# Patient Record
Sex: Female | Born: 1975 | Race: White | Hispanic: No | Marital: Married | Smoking: Never smoker
Health system: Southern US, Community
[De-identification: ages and names within clinical notes are randomized; demographics above are authoritative.]

## PROBLEM LIST (undated history)

## (undated) DIAGNOSIS — K5010 Crohn's disease of large intestine without complications: Secondary | ICD-10-CM

## (undated) DIAGNOSIS — D2339 Other benign neoplasm of skin of other parts of face: Secondary | ICD-10-CM

## (undated) DIAGNOSIS — D241 Benign neoplasm of right breast: Secondary | ICD-10-CM

## (undated) DIAGNOSIS — F419 Anxiety disorder, unspecified: Principal | ICD-10-CM

## (undated) DIAGNOSIS — G43829 Menstrual migraine, not intractable, without status migrainosus: Secondary | ICD-10-CM

## (undated) DIAGNOSIS — M1990 Unspecified osteoarthritis, unspecified site: Secondary | ICD-10-CM

## (undated) HISTORY — DX: Anxiety disorder, unspecified: F41.9

## (undated) HISTORY — PX: EYE SURGERY: SHX253

## (undated) HISTORY — DX: Benign neoplasm of right breast: D24.1

---

## 2001-01-02 ENCOUNTER — Other Ambulatory Visit: Admission: RE | Admit: 2001-01-02 | Discharge: 2001-01-02 | Payer: Self-pay | Admitting: Obstetrics and Gynecology

## 2001-06-08 ENCOUNTER — Emergency Department (HOSPITAL_COMMUNITY): Admission: EM | Admit: 2001-06-08 | Discharge: 2001-06-08 | Payer: Self-pay | Admitting: Emergency Medicine

## 2001-11-02 ENCOUNTER — Emergency Department (HOSPITAL_COMMUNITY): Admission: EM | Admit: 2001-11-02 | Discharge: 2001-11-02 | Payer: Self-pay | Admitting: Emergency Medicine

## 2002-01-18 ENCOUNTER — Encounter: Payer: Self-pay | Admitting: Internal Medicine

## 2002-01-18 ENCOUNTER — Ambulatory Visit (HOSPITAL_COMMUNITY): Admission: RE | Admit: 2002-01-18 | Discharge: 2002-01-18 | Payer: Self-pay | Admitting: Internal Medicine

## 2002-02-14 ENCOUNTER — Emergency Department (HOSPITAL_COMMUNITY): Admission: EM | Admit: 2002-02-14 | Discharge: 2002-02-14 | Payer: Self-pay | Admitting: Emergency Medicine

## 2002-05-20 ENCOUNTER — Other Ambulatory Visit: Admission: RE | Admit: 2002-05-20 | Discharge: 2002-05-20 | Payer: Self-pay | Admitting: Obstetrics and Gynecology

## 2002-05-24 LAB — HM COLONOSCOPY

## 2002-12-06 ENCOUNTER — Observation Stay (HOSPITAL_COMMUNITY): Admission: RE | Admit: 2002-12-06 | Discharge: 2002-12-06 | Payer: Self-pay | Admitting: Obstetrics and Gynecology

## 2002-12-15 ENCOUNTER — Inpatient Hospital Stay (HOSPITAL_COMMUNITY): Admission: AD | Admit: 2002-12-15 | Discharge: 2002-12-17 | Payer: Self-pay | Admitting: Obstetrics & Gynecology

## 2004-05-01 ENCOUNTER — Ambulatory Visit (HOSPITAL_COMMUNITY): Admission: RE | Admit: 2004-05-01 | Discharge: 2004-05-01 | Payer: Self-pay | Admitting: Internal Medicine

## 2004-12-15 ENCOUNTER — Emergency Department (HOSPITAL_COMMUNITY): Admission: EM | Admit: 2004-12-15 | Discharge: 2004-12-15 | Payer: Self-pay | Admitting: Emergency Medicine

## 2005-01-31 ENCOUNTER — Ambulatory Visit: Payer: Self-pay | Admitting: Internal Medicine

## 2005-06-19 ENCOUNTER — Ambulatory Visit: Payer: Self-pay | Admitting: Internal Medicine

## 2005-06-19 ENCOUNTER — Encounter (INDEPENDENT_AMBULATORY_CARE_PROVIDER_SITE_OTHER): Payer: Self-pay | Admitting: Internal Medicine

## 2005-06-19 ENCOUNTER — Ambulatory Visit (HOSPITAL_COMMUNITY): Admission: RE | Admit: 2005-06-19 | Discharge: 2005-06-19 | Payer: Self-pay | Admitting: Internal Medicine

## 2005-06-19 HISTORY — PX: COLONOSCOPY: SHX174

## 2006-01-30 ENCOUNTER — Ambulatory Visit: Payer: Self-pay | Admitting: Internal Medicine

## 2006-02-07 ENCOUNTER — Ambulatory Visit (HOSPITAL_COMMUNITY): Admission: RE | Admit: 2006-02-07 | Discharge: 2006-02-07 | Payer: Self-pay | Admitting: Internal Medicine

## 2006-04-17 ENCOUNTER — Ambulatory Visit: Payer: Self-pay | Admitting: Internal Medicine

## 2006-12-13 ENCOUNTER — Emergency Department (HOSPITAL_COMMUNITY): Admission: EM | Admit: 2006-12-13 | Discharge: 2006-12-13 | Payer: Self-pay | Admitting: Emergency Medicine

## 2007-06-11 ENCOUNTER — Other Ambulatory Visit: Admission: RE | Admit: 2007-06-11 | Discharge: 2007-06-11 | Payer: Self-pay | Admitting: Obstetrics and Gynecology

## 2008-06-13 ENCOUNTER — Other Ambulatory Visit: Admission: RE | Admit: 2008-06-13 | Discharge: 2008-06-13 | Payer: Self-pay | Admitting: Obstetrics and Gynecology

## 2009-07-06 ENCOUNTER — Other Ambulatory Visit: Admission: RE | Admit: 2009-07-06 | Discharge: 2009-07-06 | Payer: Self-pay | Admitting: Obstetrics and Gynecology

## 2009-09-19 ENCOUNTER — Ambulatory Visit (HOSPITAL_COMMUNITY): Admission: RE | Admit: 2009-09-19 | Discharge: 2009-09-19 | Payer: Self-pay | Admitting: Obstetrics & Gynecology

## 2009-10-10 ENCOUNTER — Ambulatory Visit (HOSPITAL_COMMUNITY): Admission: RE | Admit: 2009-10-10 | Discharge: 2009-10-10 | Payer: Self-pay | Admitting: Obstetrics & Gynecology

## 2009-10-31 ENCOUNTER — Ambulatory Visit (HOSPITAL_COMMUNITY): Admission: RE | Admit: 2009-10-31 | Discharge: 2009-10-31 | Payer: Self-pay | Admitting: Obstetrics & Gynecology

## 2010-03-30 ENCOUNTER — Inpatient Hospital Stay (HOSPITAL_COMMUNITY): Admission: AD | Admit: 2010-03-30 | Discharge: 2010-04-02 | Payer: Self-pay | Admitting: Obstetrics & Gynecology

## 2010-03-30 ENCOUNTER — Ambulatory Visit: Payer: Self-pay | Admitting: Advanced Practice Midwife

## 2010-07-24 ENCOUNTER — Ambulatory Visit
Admission: RE | Admit: 2010-07-24 | Discharge: 2010-07-24 | Payer: Self-pay | Source: Home / Self Care | Attending: Internal Medicine | Admitting: Internal Medicine

## 2010-07-24 LAB — CBC AND DIFFERENTIAL
HCT: 39 % (ref 36–46)
Hemoglobin: 13.2 g/dL (ref 12.0–16.0)
Platelets: 280 10*3/uL (ref 150–399)
WBC: 8.1 10^3/mL

## 2010-07-29 ENCOUNTER — Encounter: Payer: Self-pay | Admitting: Obstetrics & Gynecology

## 2010-08-02 ENCOUNTER — Emergency Department (HOSPITAL_COMMUNITY)
Admission: EM | Admit: 2010-08-02 | Discharge: 2010-08-03 | Payer: Self-pay | Source: Home / Self Care | Admitting: Emergency Medicine

## 2010-08-11 ENCOUNTER — Inpatient Hospital Stay (INDEPENDENT_AMBULATORY_CARE_PROVIDER_SITE_OTHER)
Admission: RE | Admit: 2010-08-11 | Discharge: 2010-08-11 | Disposition: A | Discharge status: Home / Self Care | Payer: BC Managed Care – PPO | Source: Ambulatory Visit | Attending: Family Medicine | Admitting: Family Medicine

## 2010-08-11 DIAGNOSIS — M109 Gout, unspecified: Secondary | ICD-10-CM

## 2010-09-20 LAB — RPR: RPR Ser Ql: NONREACTIVE

## 2010-09-20 LAB — CBC
HCT: 37.9 % (ref 36.0–46.0)
Hemoglobin: 12.9 g/dL (ref 12.0–15.0)
MCH: 30.5 pg (ref 26.0–34.0)
MCHC: 34.1 g/dL (ref 30.0–36.0)
MCV: 89.5 fL (ref 78.0–100.0)
Platelets: 156 10*3/uL (ref 150–400)
RBC: 4.24 MIL/uL (ref 3.87–5.11)
RDW: 14.8 % (ref 11.5–15.5)
WBC: 8.9 10*3/uL (ref 4.0–10.5)

## 2010-11-23 NOTE — Op Note (Signed)
   NAME:  Tamara Martin, Tamara Martin                         ACCOUNT NO.:  [id number]   MEDICAL RECORD NO.:  [id number]                   PATIENT TYPE:  INP   LOCATION:  A420                                 FACILITY:  [facility]   PHYSICIAN:  Florian Buff, M.D.                DATE OF BIRTH:  [birth year]   DATE OF PROCEDURE:  12/15/2002  DATE OF DISCHARGE:  12/17/2002                                 OPERATIVE REPORT   PROCEDURE PERFORMED:  Epidural placement.   INDICATIONS FOR PROCEDURE:  [name] is a 35 year old female Gravida 1 in  active phase of labor who requests that an epidural be placed for pain  relief.  She signed the epidural consent. She was placed in sitting  position, prepped and draped in the usual sterile fashion.  The L2-L3  interspace was identified.  1% lidocaine was injected as a local anesthetic.  A 17 gauge  Tuohy needle was used and placed in the epidural space with one  pass without difficulty.  36m of 0.125% bupivacaine was given as a test  dose with no ill effects.  An additional 148mwas given to dose up the  epidural.  An epidural catheter was placed 5 cm in to the epidural space and  a continuous pump 0.125% bupivacaine with 2 mcg per mL of fentanyl was  started.  The patient tolerated the procedure well with no ill effects and  had good pain relief.                                               LuFlorian BuffM.D.    LHEstevan OaksD:  02/01/2003  T:  02/01/2003  Job:  [id number]

## 2010-11-23 NOTE — H&P (Signed)
   NAME:  Tamara Martin, Tamara Martin                         ACCOUNT NO.:  [id number]   MEDICAL RECORD NO.:  [id number]                   PATIENT TYPE:   LOCATION:                                       FACILITY:  [facility]   PHYSICIAN:  Florian Buff, M.D.                DATE OF BIRTH:  06/11/1976   DATE OF ADMISSION:  12/15/2002  DATE OF DISCHARGE:                                HISTORY & PHYSICAL   HISTORY OF PRESENT ILLNESS:  Tamara Martin is a 35 year old, white female, G2, P1,  AB0 with estimated date of delivery of December 26, 2002, currently at 38-3/7  weeks' gestation who presented to the office this morning with cervix 3-4  cm, 50% effaced, -2 station, vertex soft and posterior.  We performed  amniotomy in the office and she is going to labor and delivery for induction  of labor.  She does have Crohn's disease and she takes Pentasa for that and  has been relatively quiescent during the pregnancy.  She has a history of an  8 pound 6 ounce baby and this child is approximately the same weight.   PAST MEDICAL HISTORY:  Crohn's disease.   PAST SURGICAL HISTORY:  Colonoscopy.   PAST OBSTETRICAL HISTORY:  Vaginal delivery in 1997, with 8 pound 6 ounce  infant.  She had an epidural.  She will have an epidural with this one as  well.   ALLERGIES:  PENICILLIN.   MEDICATIONS:  1. Vitamins.  2. Iron.  3. Pentasa four tablets four times a day.   PRENATAL LABORATORY DATA:  Blood type O positive.  Antibody screen negative.  Serology nonreactive.  Hepatitis B negative.  HIV negative.  Pap was normal.  GC and Chlamydia were negative.  Glucola was 84.  Her Group B Streptococcus  was negative.   PHYSICAL EXAMINATION:  HEENT:  Unremarkable.  Thyroid normal.  LUNGS:  Clear.  HEART:  Regular rate and rhythm without murmurs, regurgitation or gallop.  BREASTS:  Deferred.  ABDOMEN:  Fundal height 38 cm.  PELVIC:  Cervix as above.  EXTREMITIES:  Warm with no edema.   IMPRESSION:  1. Intrauterine pregnancy at  38-3/7 weeks' gestation.  2. Favorable cervix.  3. Estimated fetal weight approximately 4000 g.  4. Crohn's disease.   PLAN:  The patient is admitted for induction of labor after amniotomy.  She  understands indications and will proceed.  She does request an epidural.                                                Florian Buff, M.D.    Estevan Oaks  D:  12/15/2002  T:  12/15/2002  Job:  [id number]

## 2010-11-23 NOTE — Op Note (Signed)
   NAME:  Tamara Martin, Tamara Martin                         ACCOUNT NO.:  [id number]   MEDICAL RECORD NO.:  [id number]                   PATIENT TYPE:  INP   LOCATION:  LDR2                                 FACILITY:  [facility]   PHYSICIAN:  Florian Buff, M.D.                DATE OF BIRTH:  05-28-1976   DATE OF PROCEDURE:  12/15/2002  DATE OF DISCHARGE:                                  PROCEDURE NOTE   DELIVERY NOTE:  Date of delivery; December 15, 2002.   Onset of labor; 9 A.M.   Time of delivery; 18:46.   Length of first stage of labor; 9 hours 30 minutes.  Length of second stage  of labor; 16 minutes.  Length of third stage of labor; 4 minutes.   DETAILS OF DELIVERY:  Angelice had a normal spontaneous vaginal delivery of a  viable female infant under epidural anesthesia.  Upon delivery of head  shoulders were rotated and the rest of the infant delivered easily without  any complications upon delivery.  Nose and mouth were suctioned very  thoroughly.  The infant had good color, good tone, strong cry, and movement  of all extremities.  The cord was clamped and cut.  Infant dried and placed  on the mother's abdomen in good condition.   The third stage of labor was actively managed with 20 units of Pitocin and  1,000 mL of D5 LR at a rapid rate.   Upon inspection there was noted a first degree periurethral laceration,  which required no suturing because good hemostasis was obtained.   The placenta was delivered spontaneously via Schultze mechanism.  Membranes  were intact.  A three-vessel cord was noted upon inspection.   ESTIMATED BLOOD LOSS:  Approximately 400 mL.    DISPOSITION:  The infant and mother were both stabilized in good condition  and transferred out to the postpartum unit.   ADDENDUM:  Epidural catheter was removed with blue tip intact.       Daiva Nakayama, N.M.                      Florian Buff, M.D.    DL/MEDQ  D:  12/15/2002  T:  12/15/2002  Job:  [id number]   cc:    [facility]

## 2010-11-23 NOTE — Op Note (Signed)
[name], [name]               ACCOUNT NO.:  [id number]   MEDICAL RECORD NO.:  [id number]          PATIENT TYPE:  AMB   LOCATION:  DAY                           FACILITY:  [facility]   PHYSICIAN:  Hildred Laser, M.D.    DATE OF BIRTH:  21-Nov-1975   DATE OF PROCEDURE:  06/19/2005  DATE OF DISCHARGE:                                 OPERATIVE REPORT   PROCEDURE:  Colonoscopy.   INDICATIONS:  Tamara Martin is a 36 year old Caucasian female with 10-year history  of Crohn's disease involving the rectum and terminal ileum who is undergoing  surveillance exam. She has been maintained on mesalamine and is doing quite  well. Procedure and risks were reviewed with the patient, and informed  consent was obtained.   MEDICINES FOR CONSCIOUS SEDATION:  Demerol 50 mg IV, Versed 8 mg IV.   FINDINGS:  Procedure performed in endoscopy suite. The patient's vital signs  and O2 saturation were monitored during the procedure and remained stable.  The patient was placed in left lateral position and rectal examination  performed. No abnormality noted on external or digital exam. Olympus  videoscope was placed in rectum and advanced under vision into sigmoid colon  and beyond. Preparation was satisfactory. Scope was passed into cecum which  was identified by ileocecal valve and appendiceal orifice. Unable to examine  TI but did not send lot of time with intubation. As the scope was withdrawn,  colonic mucosa was carefully examined and was normal except rectum where  there were multiple punctate erosions involving the distal half. Pictures  taken followed by biopsy. Scope was retroflexed to examine anorectal  junction, and small hemorrhoids were noted below the dentate line. Endoscope  was straightened and withdrawn. The patient tolerated the procedure well.   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 No evidence of a mass or polyp.   RECOMMENDATIONS:   She will continue Pentasa at 1 g p.o. q.i.d. I will  contact the patient with biopsy results. May consider four-week course with  Canasa suppositories after biopsy has been reviewed.      Hildred Laser, M.D.  Electronically Signed     NR/MEDQ  D:  06/19/2005  T:  06/19/2005  Job:  [id number]   cc:   Sherrilee Gilles. Gerarda Fraction, MD  Fax: [phone]

## 2011-01-31 ENCOUNTER — Encounter (INDEPENDENT_AMBULATORY_CARE_PROVIDER_SITE_OTHER): Payer: Self-pay

## 2011-02-20 ENCOUNTER — Encounter (INDEPENDENT_AMBULATORY_CARE_PROVIDER_SITE_OTHER): Payer: Self-pay | Admitting: Internal Medicine

## 2011-02-20 ENCOUNTER — Ambulatory Visit (INDEPENDENT_AMBULATORY_CARE_PROVIDER_SITE_OTHER): Payer: BC Managed Care – PPO | Admitting: Internal Medicine

## 2011-02-20 VITALS — BP 100/70 | HR 72 | Temp 97.2°F | Ht 65.0 in | Wt 157.4 lb

## 2011-02-20 DIAGNOSIS — K5090 Crohn's disease, unspecified, without complications: Secondary | ICD-10-CM

## 2011-02-20 NOTE — Progress Notes (Signed)
Subjective:     Patient ID: Tamara Martin, female   DOB: [birth year], 35 y.o.   MRN: [id number]  HPI  MS. Thier presents today  For follow of her Crohns.  She recently called our office with symptoms of a Crohn's flare.She was started on the 19th with Canasa. She was seeing blood in her stools and she felt gassy and had indigestion.  She has been on the Canasa x 1 month.  She will be on them for about one more week.  She says she is better. No rectal bleeding. Stools are normal. She is having one stool a day. Appetite is good. No weight. Occasionally indigestion. She was diagnosed at age 66 with Crohn's by Dr. Laural Golden on colonoscopy.  Her last colonoscopy was in 2006 for surveillance. Hx of ileo-rectal Crohn's.   Review of Systems     Objective:   Physical Exam Alert and oriented. Skin warm and dry. Oral mucosa is moist. Natural teeth in good condition. Sclera anicteric, conjunctivae is pink. Thyroid not enlarged. No cervical lymphadenopathy. Lungs clear. Heart regular rate and rhythm.  Abdomen is soft. Bowel sounds are positive. No hepatomegaly. No abdominal masses felt. No tenderness.  No edema to lower extremities. Patient is alert and oriented.     Assessment:      Crohn's disease/flare. She feels 100% better now.  Plan:    [employer] .Continue present medications. She will. follow up in one year unless she has problems.

## 2011-04-01 ENCOUNTER — Ambulatory Visit (INDEPENDENT_AMBULATORY_CARE_PROVIDER_SITE_OTHER): Payer: BC Managed Care – PPO | Admitting: Internal Medicine

## 2011-04-09 ENCOUNTER — Other Ambulatory Visit: Payer: Self-pay | Admitting: Adult Health

## 2011-04-09 ENCOUNTER — Other Ambulatory Visit (HOSPITAL_COMMUNITY)
Admission: RE | Admit: 2011-04-09 | Discharge: 2011-04-09 | Disposition: A | Discharge status: Home / Self Care | Payer: BC Managed Care – PPO | Source: Ambulatory Visit | Attending: Obstetrics and Gynecology | Admitting: Obstetrics and Gynecology

## 2011-04-09 DIAGNOSIS — Z01419 Encounter for gynecological examination (general) (routine) without abnormal findings: Secondary | ICD-10-CM | POA: Insufficient documentation

## 2011-05-03 ENCOUNTER — Emergency Department (HOSPITAL_COMMUNITY)
Admission: EM | Admit: 2011-05-03 | Discharge: 2011-05-03 | Disposition: A | Discharge status: Home / Self Care | Payer: BC Managed Care – PPO | Attending: Emergency Medicine | Admitting: Emergency Medicine

## 2011-05-03 ENCOUNTER — Encounter (HOSPITAL_COMMUNITY): Payer: Self-pay

## 2011-05-03 ENCOUNTER — Emergency Department (HOSPITAL_COMMUNITY): Payer: BC Managed Care – PPO

## 2011-05-03 DIAGNOSIS — R110 Nausea: Secondary | ICD-10-CM | POA: Insufficient documentation

## 2011-05-03 DIAGNOSIS — R1013 Epigastric pain: Secondary | ICD-10-CM | POA: Insufficient documentation

## 2011-05-03 DIAGNOSIS — K5010 Crohn's disease of large intestine without complications: Secondary | ICD-10-CM | POA: Insufficient documentation

## 2011-05-03 DIAGNOSIS — R109 Unspecified abdominal pain: Secondary | ICD-10-CM | POA: Insufficient documentation

## 2011-05-03 DIAGNOSIS — R51 Headache: Secondary | ICD-10-CM | POA: Insufficient documentation

## 2011-05-03 LAB — URINALYSIS, ROUTINE W REFLEX MICROSCOPIC
Bilirubin Urine: NEGATIVE
Glucose, UA: NEGATIVE mg/dL
Ketones, ur: NEGATIVE mg/dL
Leukocytes, UA: NEGATIVE
Nitrite: NEGATIVE
Protein, ur: NEGATIVE mg/dL
Specific Gravity, Urine: 1.02 (ref 1.005–1.030)
Urobilinogen, UA: 0.2 mg/dL (ref 0.0–1.0)
pH: 6 (ref 5.0–8.0)

## 2011-05-03 LAB — CBC
HCT: 44.1 % (ref 36.0–46.0)
Hemoglobin: 14.3 g/dL (ref 12.0–15.0)
MCH: 28.5 pg (ref 26.0–34.0)
MCHC: 32.4 g/dL (ref 30.0–36.0)
MCV: 87.8 fL (ref 78.0–100.0)
Platelets: 254 10*3/uL (ref 150–400)
RBC: 5.02 MIL/uL (ref 3.87–5.11)
RDW: 13 % (ref 11.5–15.5)
WBC: 10.1 10*3/uL (ref 4.0–10.5)

## 2011-05-03 LAB — BASIC METABOLIC PANEL
BUN: 8 mg/dL (ref 6–23)
CO2: 29 mEq/L (ref 19–32)
Calcium: 9.9 mg/dL (ref 8.4–10.5)
Chloride: 98 mEq/L (ref 96–112)
Creatinine, Ser: 0.63 mg/dL (ref 0.50–1.10)
GFR calc Af Amer: >90 mL/min (ref >90)
GFR calc non Af Amer: >90 mL/min (ref >90)
Glucose, Bld: 93 mg/dL (ref 70–99)
Potassium: 3.7 mEq/L (ref 3.5–5.1)
Sodium: 135 mEq/L (ref 135–145)

## 2011-05-03 LAB — DIFFERENTIAL
Basophils Absolute: 0 10*3/uL (ref 0.0–0.1)
Basophils Relative: 0 % (ref 0–1)
Eosinophils Absolute: 0.1 10*3/uL (ref 0.0–0.7)
Eosinophils Relative: 1 % (ref 0–5)
Lymphocytes Relative: 11 % — ABNORMAL LOW (ref 12–46)
Lymphs Abs: 1.1 10*3/uL (ref 0.7–4.0)
Monocytes Absolute: 0.9 10*3/uL (ref 0.1–1.0)
Monocytes Relative: 9 % (ref 3–12)
Neutro Abs: 8.1 10*3/uL — ABNORMAL HIGH (ref 1.7–7.7)
Neutrophils Relative %: 80 % — ABNORMAL HIGH (ref 43–77)

## 2011-05-03 LAB — URINE MICROSCOPIC-ADD ON

## 2011-05-03 LAB — POCT PREGNANCY, URINE: Preg Test, Ur: NEGATIVE

## 2011-05-03 MED ORDER — OXYCODONE-ACETAMINOPHEN 5-325 MG PO TABS: 1.0000 | ORAL_TABLET | ORAL | Status: AC | PRN

## 2011-05-03 MED ORDER — SODIUM CHLORIDE 0.9 % IV BOLUS (SEPSIS)
1000.0000 mL | Freq: Once | INTRAVENOUS | Status: AC
  Administered 2011-05-03: 1000 mL via INTRAVENOUS

## 2011-05-03 MED ORDER — ONDANSETRON HCL 4 MG/2ML IJ SOLN
4.0000 mg | Freq: Once | INTRAMUSCULAR | Status: AC
  Administered 2011-05-03: 4 mg via INTRAVENOUS
  Filled 2011-05-03: qty 2

## 2011-05-03 MED ORDER — IOHEXOL 300 MG/ML  SOLN
100.0000 mL | Freq: Once | INTRAMUSCULAR | Status: AC | PRN
  Administered 2011-05-03: 100 mL via INTRAVENOUS

## 2011-05-03 MED ORDER — HYDROMORPHONE HCL 1 MG/ML IJ SOLN
1.0000 mg | Freq: Once | INTRAMUSCULAR | Status: AC
  Administered 2011-05-03: 1 mg via INTRAVENOUS
  Filled 2011-05-03: qty 1

## 2011-05-03 NOTE — ED Provider Notes (Signed)
History   This chart was scribed for Sharyon Cable, MD by Carolyne Littles. The patient was seen in room APA11/APA11 and the patient's care was started at 8:30PM.  CSN: [id number] Arrival date & time: 05/03/2011  7:43 PM   First MD Initiated Contact with Patient 05/03/11 2008      Chief Complaint  Patient presents with  . Flank Pain  . Abdominal Pain  . Nausea   HPI Tamara Martin is a 35 y.o. female who presents to the Emergency Department complaining of constant moderate, non-radiating abdominal pain described as cramping onset last night which awoke her from sleep and persistent since with associated nausea and mild dull HA. Denies vomiting, diarrhea, rectal bleeding, fever, dysuria, vaginal bleeding, vaginal d/c, cough, SOB. Reports current symptoms are not similar to episode of Crohn's exacerbations. Denies h/o abdominal surgery.  GI- Dr. Laural Golden   Past Medical History  Diagnosis Date  . Crohn's disease of rectum 07/24/2010  . Rectal bleeding 07/24/2010    Past Surgical History  Procedure Date  . Colonoscopy 2006    History reviewed. No pertinent family history.  History  Substance Use Topics  . Smoking status: Never Smoker   . Smokeless tobacco: Not on file  . Alcohol Use: No    OB History    Grav Para Term Preterm Abortions TAB SAB Ect Mult Living                  Review of Systems 10 Systems reviewed and are negative for acute change except as noted in the HPI.  Allergies  Penicillins  Home Medications   Current Outpatient Rx  Name Route Sig Dispense Refill  . MESALAMINE 500 MG PO CPCR Oral Take 1,000 mg by mouth 4 (four) times daily.     Creed Copper M PLUS PO TABS Oral Take 1 tablet by mouth daily.      Marland Kitchen OVER THE COUNTER MEDICATION Oral Take 1 tablet by mouth daily. COLON HEALTH: Supplement     . OVER THE COUNTER MEDICATION Oral Take 1 tablet by mouth daily. ANTI-STRESS: Supplements     . PROMETHAZINE HCL 25 MG RE SUPP Rectal Place 25 mg rectally  every 8 (eight) hours as needed. For nausea     . FLINTSTONES COMPLETE 60 MG PO CHEW Oral Chew 1 tablet by mouth daily.        BP 123/58  Pulse 122  Temp(Src) 99.2 F (37.3 C) (Oral)  Resp 18  Ht 5' 5"  (1.651 m)  Wt 153 lb (69.4 kg)  BMI 25.46 kg/m2  SpO2 100%  LMP 04/19/2011  Physical Exam CONSTITUTIONAL: Well developed/well nourished HEAD AND FACE: Normocephalic/atraumatic EYES: EOMI/PERRL ENMT: Mucous membranes moist NECK: supple no meningeal signs CV: S1/S2 noted, no murmurs/rubs/gallops noted, tachycardic LUNGS: Lungs are clear to auscultation bilaterally, no apparent distress ABDOMEN: soft, no rebound or guarding, focal tenderness over umbilicus, no RLQ tenderness noted GU:no cva tenderness NEURO: Pt is awake/alert, moves all extremitiesx4 EXTREMITIES: pulses normal, full ROM SKIN: warm, color normal PSYCH: no abnormalities of mood noted  ED Course  Procedures (including critical care time)  DIAGNOSTIC STUDIES: Oxygen Saturation is 100% on room air, normal by my interpretation.    COORDINATION OF CARE: 9:55PM- Patient reports abdominal pain much improved following administration of pain medications. Informed of lab and imaging results. Patient offered option of having a pelvic exam performed and declines. Patient will attempt to tolerate fluids PO. Intent to d/c home. Patient agrees with plan set forth  at this time.   10:55 PM abd soft, no focal RLQ tenderness.  Doubt acute GYN process She feels improved I discussed at length signs/symptoms of when to return  Labs Reviewed  URINALYSIS, [place] - Abnormal; Notable for the following:    Hgb urine dipstick TRACE (*)    All other components within normal limits  DIFFERENTIAL - Abnormal; Notable for the following:    Neutrophils Relative 80 (*)    Neutro Abs 8.1 (*)    Lymphocytes Relative 11 (*)    All other components within normal limits  URINE MICROSCOPIC-ADD ON - Abnormal; Notable for  the following:    Squamous Epithelial / LPF FEW (*)    All other components within normal limits  CBC  BASIC METABOLIC PANEL  POCT PREGNANCY, URINE  POCT PREGNANCY, URINE   Ct Abdomen Pelvis W Contrast  05/03/2011  *RADIOLOGY REPORT*  Clinical Data: Epigastric abdominal pain and bilateral flank pain. Nausea and vomiting.  CT ABDOMEN AND PELVIS WITH CONTRAST 05/03/2011:  Technique:  Multidetector CT imaging of the abdomen and pelvis was performed following the standard protocol during bolus administration of intravenous contrast.  Contrast: 165m OMNIPAQUE IOHEXOL 300 MG/ML IV. Oral contrast was also administered.  Comparison: None.  Findings: Normal appearing liver, spleen, adrenal glands, and kidneys.  Atrophic pancreatic tail, with normal appearing pancreas otherwise.  Gallbladder unremarkable by CT.  No biliary ductal dilation.  No visible aorto-iliofemoral atherosclerosis.  No significant lymphadenopathy.  Stomach, small bowel, and colon normal in appearance.  Liquid stool in the cecum causing air-fluid levels.  Normal appearing decompressed appendix in the right upper pelvis.  No ascites.  Uterus retroverted but normal in appearance for age.  Bilateral ovarian cysts consistent with age.  No adnexal masses or free pelvic fluid.  Urinary bladder decompressed and unremarkable. Pelvic phleboliths.  Bone window images unremarkable.  Calcified granuloma in the right lower lobe; visualized lung bases otherwise clear.  Heart size normal.  IMPRESSION:  1.  No acute abnormalities involving the abdomen or pelvis. 2.  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 3.  Calcified granuloma deep in the right lower lobe.  Original Report Authenticated By: TDeniece Portela M.D.        MDM  Nursing notes reviewed and considered in documentation All labs/vitals reviewed and considered    I personally performed the services described in  this documentation, which was scribed in my presence. The recorded information has been reviewed and considered.     DSharyon Cable MD 05/03/11 2257

## 2011-05-03 NOTE — ED Notes (Signed)
Pt presents with abdominal pain, nausea, and right sided flank pain. Pt denies V/D. Pt states symptoms started last night.

## 2011-05-03 NOTE — ED Notes (Signed)
Generalized abd pain, nausea, no vomiting, no diarrhea.  Color good, NAD.

## 2011-08-05 ENCOUNTER — Telehealth (INDEPENDENT_AMBULATORY_CARE_PROVIDER_SITE_OTHER): Payer: Self-pay | Admitting: *Deleted

## 2011-08-05 NOTE — Telephone Encounter (Signed)
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 The doctor put her on  Sulfa sylizine and was told this would treat her athristis and crohn's diease. Tamara Martin is want to know if she should continue the Pentasa? Scheduled her an apt to see Dr. Laural Golden next Monday 08/12/11 but still would like for the nurse to return her call. The return phone number is [phone].

## 2011-08-07 NOTE — Telephone Encounter (Signed)
Patient was called and told that per Dr. Laural Golden, to keep appointment on 08-12-11 and the medication will be discussed at that time.

## 2011-08-12 ENCOUNTER — Ambulatory Visit (INDEPENDENT_AMBULATORY_CARE_PROVIDER_SITE_OTHER): Payer: BC Managed Care – PPO | Admitting: Internal Medicine

## 2011-08-12 ENCOUNTER — Encounter (INDEPENDENT_AMBULATORY_CARE_PROVIDER_SITE_OTHER): Payer: Self-pay | Admitting: Internal Medicine

## 2011-08-12 VITALS — BP 100/74 | HR 76 | Temp 97.0°F | Resp 14 | Ht 65.0 in | Wt 158.0 lb

## 2011-08-12 DIAGNOSIS — K5090 Crohn's disease, unspecified, without complications: Secondary | ICD-10-CM

## 2011-08-12 DIAGNOSIS — M1712 Unilateral primary osteoarthritis, left knee: Secondary | ICD-10-CM | POA: Insufficient documentation

## 2011-08-12 NOTE — Patient Instructions (Signed)
Take one B complex tablet by mouth daily(OTC) Calcium with vitamin D 2 tablets daily(OTC)

## 2011-08-12 NOTE — Progress Notes (Signed)
Presenting complaint; Followup for Crohn's disease. Subjective: Tamara Martin is 36 year old Caucasian female who is 48 year history of ileal and rectal Crohn's disease who is here for scheduled visit. About 6 months ago she was felt to have mild flareup with rectal symptoms and treated with Canasa suppositories. She now feels fine. She generally has one formed stool daily. She denies rectal discharge melena or rectal bleeding. She also denies abdominal pain. She has a good appetite. She complains of excessive flatulence. She's been taking: Health( probiotic) for one month and she believes is helping her. She developed left knee arthritis with effusion she was seen by Dr. 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 She was given sulfasalazine which she stopped after taking for a week but she developed multiple side effects including fatigue and she felt depressed. She was also given an option off Humira but but after reading the side effects she is not sure if she wants to be treated with Humira. Patient developed acute onset of epigastric pain on 05/03/2011 and was evaluated in emergency room. She had abdominopelvic CT which was unremarkable. She also had lab studies which are unremarkable. She has not experienced relapse of this pain.  Current Medications: Current Outpatient Prescriptions  Medication Sig Dispense Refill  . cyclobenzaprine (FLEXERIL) 10 MG tablet 10 mg. As needed for headaches      . mesalamine (PENTASA) 500 MG CR capsule Take 1,000 mg by mouth 4 (four) times daily.       Marland Kitchen OVER THE COUNTER MEDICATION Take 1 tablet by mouth daily. COLON HEALTH: Supplement       . promethazine (PHENERGAN) 25 MG suppository Place 25 mg rectally every 8 (eight) hours as needed. For nausea         Objective: Blood pressure 100/74, pulse 76, temperature 97 F (36.1 C), temperature source Oral, resp. rate 14, height 5' 5"  (1.651 m), weight 158 lb (71.668 kg), last menstrual period  08/05/2011.  Conjunctiva is pink. Sclera is nonicteric Oral pharyngeal mucosa is normal. No neck masses or thyromegaly noted. Cardiac exam with regular rhythm normal S1 and S2. No murmur or gallop noted. Lungs are clear to auscultation. Abdomen abdomen is soft and nontender without organomegaly or masses the No LE edema or clubbing noted.  Lab data; Abdominopelvic CT showed some 05/03/2011 reviewed no evidence of ileal wall thickening. WBC 10.1, hemoglobin 14.3, hematocrit 44.1, platelet 254K ( 05/03/2011)  Assessment: #1. Ileal and rectal Crohn's disease remains in remission. Patient has been on Pentasa for several years and has done well. 2. 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   Plan: Continue Pentasa at 1 g by mouth 4 times a day. B complex 1 tablet by mouth daily. Calcium with vitamin D 2 tablets daily. Office visit in one year.

## 2011-10-16 ENCOUNTER — Other Ambulatory Visit (INDEPENDENT_AMBULATORY_CARE_PROVIDER_SITE_OTHER): Payer: Self-pay | Admitting: Internal Medicine

## 2012-08-22 ENCOUNTER — Other Ambulatory Visit (INDEPENDENT_AMBULATORY_CARE_PROVIDER_SITE_OTHER): Payer: Self-pay | Admitting: Internal Medicine

## 2012-08-29 ENCOUNTER — Other Ambulatory Visit (INDEPENDENT_AMBULATORY_CARE_PROVIDER_SITE_OTHER): Payer: Self-pay | Admitting: Internal Medicine

## 2012-08-31 ENCOUNTER — Encounter (INDEPENDENT_AMBULATORY_CARE_PROVIDER_SITE_OTHER): Payer: Self-pay | Admitting: *Deleted

## 2012-09-14 ENCOUNTER — Ambulatory Visit (INDEPENDENT_AMBULATORY_CARE_PROVIDER_SITE_OTHER): Payer: BC Managed Care – PPO | Admitting: Internal Medicine

## 2012-09-21 ENCOUNTER — Ambulatory Visit (INDEPENDENT_AMBULATORY_CARE_PROVIDER_SITE_OTHER): Payer: BC Managed Care – PPO | Admitting: Internal Medicine

## 2012-10-12 ENCOUNTER — Other Ambulatory Visit: Payer: Self-pay | Admitting: Adult Health

## 2012-10-14 ENCOUNTER — Encounter: Payer: Self-pay | Admitting: *Deleted

## 2012-10-14 DIAGNOSIS — Z8669 Personal history of other diseases of the nervous system and sense organs: Secondary | ICD-10-CM | POA: Insufficient documentation

## 2012-10-15 ENCOUNTER — Ambulatory Visit (INDEPENDENT_AMBULATORY_CARE_PROVIDER_SITE_OTHER): Payer: BC Managed Care – PPO | Admitting: Adult Health

## 2012-10-15 ENCOUNTER — Other Ambulatory Visit (HOSPITAL_COMMUNITY)
Admission: RE | Admit: 2012-10-15 | Discharge: 2012-10-15 | Disposition: A | Discharge status: Home / Self Care | Payer: BC Managed Care – PPO | Source: Ambulatory Visit | Attending: Adult Health | Admitting: Adult Health

## 2012-10-15 ENCOUNTER — Encounter: Payer: Self-pay | Admitting: Adult Health

## 2012-10-15 VITALS — BP 120/70 | HR 76 | Ht 65.0 in | Wt 154.0 lb

## 2012-10-15 DIAGNOSIS — Z0000 Encounter for general adult medical examination without abnormal findings: Secondary | ICD-10-CM

## 2012-10-15 DIAGNOSIS — R319 Hematuria, unspecified: Secondary | ICD-10-CM

## 2012-10-15 DIAGNOSIS — Z01419 Encounter for gynecological examination (general) (routine) without abnormal findings: Secondary | ICD-10-CM | POA: Insufficient documentation

## 2012-10-15 DIAGNOSIS — Z1151 Encounter for screening for human papillomavirus (HPV): Secondary | ICD-10-CM | POA: Insufficient documentation

## 2012-10-15 DIAGNOSIS — K5090 Crohn's disease, unspecified, without complications: Secondary | ICD-10-CM

## 2012-10-15 DIAGNOSIS — J329 Chronic sinusitis, unspecified: Secondary | ICD-10-CM

## 2012-10-15 DIAGNOSIS — M545 Low back pain: Secondary | ICD-10-CM

## 2012-10-15 DIAGNOSIS — Z8669 Personal history of other diseases of the nervous system and sense organs: Secondary | ICD-10-CM

## 2012-10-15 LAB — POCT URINALYSIS DIPSTICK
Ketones, UA: NEGATIVE
Protein, UA: NEGATIVE

## 2012-10-15 MED ORDER — AZITHROMYCIN 250 MG PO TABS: ORAL_TABLET | ORAL | Status: DC

## 2012-10-15 NOTE — Progress Notes (Signed)
Patient ID: Tamara Martin, female   DOB: 1975/12/22, 37 y.o.   MRN: [id number] History of Present Illness: Tamara Martin is a 37 year old white female in for pap and physical. She is having some nasal symptoms and low back pain and stress urinary incontinence.   Current Medications, Allergies, Past Medical History, Past Surgical History, Family History and Social History were reviewed in [employer] record.     Review of Systems:Patient denies  any  blurred vision, shortness of breath, chest pain, abdominal pain, problems with bowel movements, or intercourse. She does have some stress incontinence at times and low back ache. She has some headaches occasionally and nasal symptoms.She has some pain in her knees at times.No mood changes.She is currently using condoms and trying to get her husband to get vasectomy.We discussed POPs and her headaches and decided to continue condoms.  Physical Exam:Blood pressure 120/70, pulse 76, height 5' 5"  (1.651 m), weight 154 lb (69.854 kg), last menstrual period 09/24/2012.BMI:26.Urine:+Blood and WBC General:  Well developed, well nourished, no acute distress Skin:  Warm and dry, positive frontal sinus tenderness Neck:  Midline trachea, normal thyroid Lungs; Clear to auscultation bilaterally Breast:  No dominant palpable mass, retraction, or nipple discharge Cardiovascular: Regular rate and rhythm Abdomen:  Soft, non tender, no hepatosplenomegaly. No CVAT Pelvic:  External genitalia is normal in appearance.  The vagina is normal in appearance. The cervix is bulbous.  Uterus is felt to be normal size, shape, and contour.  No adnexal masses or tenderness noted. Extremities:  No swelling or varicosities noted Psych:  No mood changes   Impression: Yearly exam Hematuria Sinus infection Hx. Crohn's disease Hx. migraines  Plan:Rx. Z pack Increase fluids Mammogram at 40  Continue condom use Follow up urine next week Return to clinic 1  year physical,prn problems

## 2012-10-15 NOTE — Patient Instructions (Addendum)
Try zyrtec or allegra OTC Increase Fluids Take  Z-pack Follow up 1 year physical Mammogram at 14. Chart up for my chart

## 2012-10-16 DIAGNOSIS — J3489 Other specified disorders of nose and nasal sinuses: Secondary | ICD-10-CM | POA: Insufficient documentation

## 2012-10-16 LAB — URINALYSIS
Glucose, UA: NEGATIVE mg/dL
Hgb urine dipstick: NEGATIVE
Ketones, ur: NEGATIVE mg/dL
Nitrite: NEGATIVE
Protein, ur: NEGATIVE mg/dL
pH: 7 (ref 5.0–8.0)

## 2012-10-17 ENCOUNTER — Encounter (HOSPITAL_COMMUNITY): Payer: Self-pay

## 2012-10-17 ENCOUNTER — Emergency Department (HOSPITAL_COMMUNITY)
Admission: EM | Admit: 2012-10-17 | Discharge: 2012-10-17 | Discharge status: Against Medical Advice | Payer: BC Managed Care – PPO | Attending: Emergency Medicine | Admitting: Emergency Medicine

## 2012-10-17 NOTE — ED Notes (Signed)
Sinus pressure, throat irritation, states started a z pack on Thursday but has not helped

## 2012-10-17 NOTE — ED Notes (Signed)
Pt called in all waiting areas without any answer

## 2012-10-17 NOTE — ED Notes (Signed)
No answer in all waiting areas

## 2012-11-16 ENCOUNTER — Ambulatory Visit (INDEPENDENT_AMBULATORY_CARE_PROVIDER_SITE_OTHER): Payer: BC Managed Care – PPO | Admitting: Internal Medicine

## 2012-11-16 ENCOUNTER — Encounter (INDEPENDENT_AMBULATORY_CARE_PROVIDER_SITE_OTHER): Payer: Self-pay | Admitting: Internal Medicine

## 2012-11-16 ENCOUNTER — Telehealth: Payer: Self-pay | Admitting: Adult Health

## 2012-11-16 VITALS — BP 114/74 | HR 76 | Temp 99.1°F | Resp 18 | Ht 68.0 in | Wt 151.4 lb

## 2012-11-16 DIAGNOSIS — K5090 Crohn's disease, unspecified, without complications: Secondary | ICD-10-CM

## 2012-11-16 NOTE — Patient Instructions (Signed)
Notify if you have abdominal pain, diarrhea or rectal bleeding.

## 2012-11-16 NOTE — Telephone Encounter (Signed)
Pt informed of WNL pap and no growth with urine culture

## 2012-11-16 NOTE — Progress Notes (Signed)
Presenting complaint;  Followup for Crohn's disease.  Subjective:  Tamara Martin is 37 year old Caucasian female with history of ileal and rectal Crohn's disease. She was last seen in February 2013. She feels well. She has no complaints. Her bowels move daily. She denies abdominal pain melena or rectal bleeding. She has very good appetite. She has lost 7 pounds since her last visit which is voluntary. She is trying to increase fiber intake and also takes fiber gummy pill. Patient's last colonoscopy was in December 2006 revealing proctitis. TI could not be examined. She had normal small bowel follow-through in August 2007.   Current Medications: Current Outpatient Prescriptions  Medication Sig Dispense Refill  . b complex vitamins capsule Take 1 capsule by mouth daily.      . calcium-vitamin D (OSCAL WITH D) 500-200 MG-UNIT per tablet Take 1 tablet by mouth daily.      . cholecalciferol (VITAMIN D) 1000 UNITS tablet Take 1,000 Units by mouth every other day.      . cyclobenzaprine (FLEXERIL) 10 MG tablet 10 mg. As needed for headaches      . fish oil-omega-3 fatty acids 1000 MG capsule Take 1 g by mouth daily.      Marland Kitchen OVER THE COUNTER MEDICATION Take 1 tablet by mouth daily. COLON HEALTH: Supplement       . PENTASA 500 MG CR capsule TAKE 2 CAPSULES BY MOUTH 4 TIMES A DAY  240 capsule  11   No current facility-administered medications for this visit.     Objective: Blood pressure 114/74, pulse 76, temperature 99.1 F (37.3 C), temperature source Oral, resp. rate 18, height 5' 8"  (1.727 m), weight 151 lb 6.4 oz (68.675 kg). Patient is alert and in no acute distress. Conjunctiva is pink. Sclera is nonicteric Oropharyngeal mucosa is normal. No neck masses or thyromegaly noted. Cardiac exam with regular rhythm normal S1 and S2. No murmur or gallop noted. Lungs are clear to auscultation. Abdomen is symmetrical soft and nontender without organomegaly or masses to No LE edema or clubbing  noted.  Labs/studies Results: Patient will bring [place] a copy of the blood work that she had through her employer. Also reviewed abdominopelvic CT from October 2012 and she was seen in emergency room. There is a short loop of proximal jejunum which appeared to be thickened. TI was normal.  Assessment:  #1. Ileo-rectal Crohn's disease. Disease duration about 18 years. She has had very mild course. She is doing very well with oral mesalamine. Brief illness in October 2012 would appear to be due to 2 acute jejunitis    Plan:  Continue Pentasa at present dose. Patient will send [place] a copy of blood work for review. Office visit in one year.

## 2012-11-23 ENCOUNTER — Encounter (INDEPENDENT_AMBULATORY_CARE_PROVIDER_SITE_OTHER): Payer: Self-pay

## 2012-12-11 ENCOUNTER — Encounter (INDEPENDENT_AMBULATORY_CARE_PROVIDER_SITE_OTHER): Payer: Self-pay

## 2013-02-24 ENCOUNTER — Encounter: Payer: Self-pay | Admitting: Adult Health

## 2013-02-24 ENCOUNTER — Ambulatory Visit (INDEPENDENT_AMBULATORY_CARE_PROVIDER_SITE_OTHER): Payer: BC Managed Care – PPO | Admitting: Adult Health

## 2013-02-24 VITALS — BP 118/82 | Ht 65.0 in | Wt 154.0 lb

## 2013-02-24 DIAGNOSIS — R5383 Other fatigue: Secondary | ICD-10-CM

## 2013-02-24 DIAGNOSIS — R319 Hematuria, unspecified: Secondary | ICD-10-CM

## 2013-02-24 DIAGNOSIS — N926 Irregular menstruation, unspecified: Secondary | ICD-10-CM | POA: Insufficient documentation

## 2013-02-24 DIAGNOSIS — R5381 Other malaise: Secondary | ICD-10-CM

## 2013-02-24 LAB — COMPREHENSIVE METABOLIC PANEL
BUN: 13 mg/dL (ref 6–23)
CO2: 29 mEq/L (ref 19–32)
Calcium: 9.5 mg/dL (ref 8.4–10.5)
Chloride: 102 mEq/L (ref 96–112)
Creat: 0.65 mg/dL (ref 0.50–1.10)
Glucose, Bld: 82 mg/dL (ref 70–99)
Total Bilirubin: 0.3 mg/dL (ref 0.3–1.2)

## 2013-02-24 LAB — POCT URINALYSIS DIPSTICK
Blood, UA: 1
Nitrite, UA: NEGATIVE

## 2013-02-24 LAB — CBC
HCT: 39.3 % (ref 36.0–46.0)
MCH: 29.1 pg (ref 26.0–34.0)
MCV: 88.5 fL (ref 78.0–100.0)
RBC: 4.44 MIL/uL (ref 3.87–5.11)
WBC: 8 10*3/uL (ref 4.0–10.5)

## 2013-02-24 LAB — TSH: TSH: 1.151 u[IU]/mL (ref 0.350–4.500)

## 2013-02-24 NOTE — Patient Instructions (Addendum)

## 2013-02-24 NOTE — Progress Notes (Signed)
Subjective:     Patient ID: Flavia Shipper, female   DOB: 08/07/1975, 37 y.o.   MRN: [id number]  HPI Amyia is a 37 year old white female in complaining of fatigue,hair loss increasing migraines and irregular cycles,and she has had discomfort in low back recently.She has history of Crohn's disease.She is off OCs and using condoms.Husband to get vasectomy in September.  Review of Systems Positives in HPI Reviewed past medical,surgical, social and family history. Reviewed medications and allergies.     Objective:   Physical Exam BP 118/82  Ht 5' 5"  (1.651 m)  Wt 154 lb (69.854 kg)  BMI 25.63 kg/m2  LMP 02/11/2013   Urine dipstick +1 blood and trace protein,  Talk only today, see HPI Assessment:     Fatigue Irregular menses Hair loss Increasing migraines  Hematuria     Plan:    Check CBC,CMP,TSH Urine sent for UA C&S Follow up in 1 week ,can use my chart to check labs    Review handout on fatigue

## 2013-02-25 LAB — URINALYSIS
Glucose, UA: NEGATIVE mg/dL
Hgb urine dipstick: NEGATIVE
Leukocytes, UA: NEGATIVE
Nitrite: NEGATIVE
Protein, ur: NEGATIVE mg/dL
Urobilinogen, UA: 1 mg/dL (ref 0.0–1.0)

## 2013-02-26 ENCOUNTER — Telehealth: Payer: Self-pay | Admitting: Adult Health

## 2013-02-26 LAB — URINE CULTURE
Colony Count: NO GROWTH
Organism ID, Bacteria: NO GROWTH

## 2013-02-26 NOTE — Telephone Encounter (Signed)
Pt aware of labs  

## 2013-03-03 ENCOUNTER — Ambulatory Visit: Payer: BC Managed Care – PPO | Admitting: Adult Health

## 2013-03-04 ENCOUNTER — Encounter: Payer: Self-pay | Admitting: Adult Health

## 2013-03-04 ENCOUNTER — Ambulatory Visit (INDEPENDENT_AMBULATORY_CARE_PROVIDER_SITE_OTHER): Payer: BC Managed Care – PPO | Admitting: Adult Health

## 2013-03-04 VITALS — BP 120/80 | Ht 65.0 in | Wt 155.0 lb

## 2013-03-04 DIAGNOSIS — F419 Anxiety disorder, unspecified: Secondary | ICD-10-CM | POA: Insufficient documentation

## 2013-03-04 DIAGNOSIS — F411 Generalized anxiety disorder: Secondary | ICD-10-CM

## 2013-03-04 HISTORY — DX: Anxiety disorder, unspecified: F41.9

## 2013-03-04 MED ORDER — SERTRALINE HCL 25 MG PO TABS: 25.0000 mg | ORAL_TABLET | Freq: Every day | ORAL | Status: DC

## 2013-03-04 NOTE — Patient Instructions (Addendum)
Anxiety and Panic Attacks Your caregiver has informed you that you are having an anxiety or panic attack. There may be many forms of this. Most of the time these attacks come suddenly and without warning. They come at any time of day, including periods of sleep, and at any time of life. They may be strong and unexplained. Although panic attacks are very scary, they are physically harmless. Sometimes the cause of your anxiety is not known. Anxiety is a protective mechanism of the body in its fight or flight mechanism. Most of these perceived danger situations are actually nonphysical situations (such as anxiety over losing a job). CAUSES  The causes of an anxiety or panic attack are many. Panic attacks may occur in otherwise healthy people given a certain set of circumstances. There may be a genetic cause for panic attacks. Some medications may also have anxiety as a side effect. SYMPTOMS  Some of the most common feelings are:  Intense terror.  Dizziness, feeling faint.  Hot and cold flashes.  Fear of going crazy.  Feelings that nothing is real.  Sweating.  Shaking.  Chest pain or a fast heartbeat (palpitations).  Smothering, choking sensations.  Feelings of impending doom and that death is near.  Tingling of extremities, this may be from over-breathing.  Altered reality (derealization).  Being detached from yourself (depersonalization). Several symptoms can be present to make up anxiety or panic attacks. DIAGNOSIS  The evaluation by your caregiver will depend on the type of symptoms you are experiencing. The diagnosis of anxiety or panic attack is made when no physical illness can be determined to be a cause of the symptoms. TREATMENT  Treatment to prevent anxiety and panic attacks may include:  Avoidance of circumstances that cause anxiety.  Reassurance and relaxation.  Regular exercise.  Relaxation therapies, such as yoga.  Psychotherapy with a psychiatrist or  therapist.  Avoidance of caffeine, alcohol and illegal drugs.  Prescribed medication. SEEK IMMEDIATE MEDICAL CARE IF:   You experience panic attack symptoms that are different than your usual symptoms.  You have any worsening or concerning symptoms. Document Released: 06/24/2005 Document Revised: 09/16/2011 Document Reviewed: 10/26/2009 [facility] Patient Information 2014 [place], [place]. Start zoloft  Follow up in 6 weeks

## 2013-03-04 NOTE — Progress Notes (Signed)
Subjective:     Patient ID: Tamara Martin, female   DOB: [birth year], 37 y.o.   MRN: [id number]  HPI Tamara Martin is back to review and labs and talk,labs were al normal, job is stressful and she feels torn between work and home.Teary today when discussing work.  Review of Systems See HPI Reviewed past medical,surgical, social and family history. Reviewed medications and allergies.     Objective:   Physical Exam BP 120/80  Ht 5' 5"  (1.651 m)  Wt 155 lb (70.308 kg)  BMI 25.79 kg/m2  LMP 02/11/2013   talk only ,see HPI, discussed that her fatigue and hair loss and irregular cycles may be stress related and that she is having anxiety.  Assessment:     Anxiety     Plan:     Rx zoloft 25 mg #30 1 daily with 6 refills Take time for self and if it does not matter in 2 weeks it does not matter today Review handout on anxiety Follow up in 6 weeks

## 2013-03-10 ENCOUNTER — Encounter: Payer: Self-pay | Admitting: Adult Health

## 2013-03-10 ENCOUNTER — Telehealth: Payer: Self-pay | Admitting: *Deleted

## 2013-03-10 NOTE — Telephone Encounter (Signed)
Pt states Zoloft 25 mg making her very sleepy. Pt states has tried taking at night but still feels sleepy during the day. Also, states blood showed in her urine at her last appt and concerned. Pt informed per Derrek Monaco, NP that she can take 1/2 of the 25 mg Zoloft and see if that help improves symptoms. Pt also informed UA and culture done at 02/24/2013 appt and were both negative. Pt verbalized understanding.

## 2013-04-15 ENCOUNTER — Ambulatory Visit (INDEPENDENT_AMBULATORY_CARE_PROVIDER_SITE_OTHER): Payer: BC Managed Care – PPO | Admitting: Adult Health

## 2013-04-15 ENCOUNTER — Encounter: Payer: Self-pay | Admitting: Adult Health

## 2013-04-15 VITALS — BP 120/78 | Ht 65.0 in | Wt 156.0 lb

## 2013-04-15 DIAGNOSIS — F411 Generalized anxiety disorder: Secondary | ICD-10-CM

## 2013-04-15 DIAGNOSIS — F419 Anxiety disorder, unspecified: Secondary | ICD-10-CM

## 2013-04-15 NOTE — Progress Notes (Signed)
Subjective:     Patient ID: Tamara Martin, female   DOB: 05/13/1976, 37 y.o.   MRN: [id number]  HPI Tamara Martin is a 37 year old white female in today in follow up of starting zoloft and she says she is much better, and does not seem to have any side effects.   Review of Systems See HPI Reviewed past medical,surgical, social and family history. Reviewed medications and allergies.     Objective:   Physical Exam BP 120/78  Ht 5' 5"  (1.651 m)  Wt 156 lb (70.761 kg)  BMI 25.96 kg/m2  LMP 04/07/2013   we just talked today and she is better so will continue current dose  Assessment:     Anxiety     Plan:     Continue zoloft  Call if any problems or concerns, follow up prn

## 2013-04-15 NOTE — Patient Instructions (Signed)
Continue zoloft 25 mg Follow up prn

## 2013-05-13 ENCOUNTER — Other Ambulatory Visit: Payer: Self-pay

## 2013-07-04 ENCOUNTER — Other Ambulatory Visit: Payer: Self-pay | Admitting: Adult Health

## 2013-09-14 ENCOUNTER — Encounter (INDEPENDENT_AMBULATORY_CARE_PROVIDER_SITE_OTHER): Payer: Self-pay | Admitting: *Deleted

## 2013-09-14 ENCOUNTER — Other Ambulatory Visit: Payer: Self-pay | Admitting: Adult Health

## 2013-09-14 ENCOUNTER — Other Ambulatory Visit (INDEPENDENT_AMBULATORY_CARE_PROVIDER_SITE_OTHER): Payer: Self-pay | Admitting: Internal Medicine

## 2013-09-14 DIAGNOSIS — K5010 Crohn's disease of large intestine without complications: Secondary | ICD-10-CM

## 2013-09-28 ENCOUNTER — Telehealth (INDEPENDENT_AMBULATORY_CARE_PROVIDER_SITE_OTHER): Payer: Self-pay | Admitting: *Deleted

## 2013-09-28 NOTE — Telephone Encounter (Signed)
Per Dr.Rehman may call in Canasa Suppository - 1 per rectal at bedtime #30 with 2 refills. This was called to [facility] [place]. A message was left for the on her voicemail, letting her know this.

## 2013-09-28 NOTE — Telephone Encounter (Signed)
Tamara Martin has seen blood in her stool. Dr. Laural Golden has given her [place] when this has happened in the past. Would like to know if he would please send in a Rx for this. Her return phone number is [phone].

## 2013-11-10 ENCOUNTER — Ambulatory Visit (INDEPENDENT_AMBULATORY_CARE_PROVIDER_SITE_OTHER): Payer: BC Managed Care – PPO | Admitting: Adult Health

## 2013-11-10 ENCOUNTER — Encounter: Payer: Self-pay | Admitting: Adult Health

## 2013-11-10 VITALS — BP 110/70 | HR 76 | Ht 65.0 in | Wt 148.0 lb

## 2013-11-10 DIAGNOSIS — Z01419 Encounter for gynecological examination (general) (routine) without abnormal findings: Secondary | ICD-10-CM

## 2013-11-10 NOTE — Patient Instructions (Signed)
Physical in 1 year Mammogram at 40 Call prn

## 2013-11-10 NOTE — Progress Notes (Signed)
Patient ID: Tamara Martin, female   DOB: [birth year], 38 y.o.   MRN: [id number] History of Present Illness: Tamara Martin is a 38 year old white female, married in for a physical.She had a normal pap with negative HPV 10/15/12  Current Medications, Allergies, Past Medical History, Past Surgical History, Family History and Social History were reviewed in [employer] record.     Review of Systems: Patient denies any headaches, blurred vision, shortness of breath, chest pain, abdominal pain, problems with bowel movements, urination, or intercourse. She did have a flare with her crohn's but is better and has follow up 5/11 with GI.No joint pain or mood changes taking Zoloft.    Physical Exam:BP 110/70  Pulse 76  Ht 5' 5"  (1.651 m)  Wt 148 lb (67.132 kg)  BMI 24.63 kg/m2  LMP 10/25/2013 General:  Well developed, well nourished, no acute distress Skin:  Warm and dry Neck:  Midline trachea, normal thyroid Lungs; Clear to auscultation bilaterally Breast:  No dominant palpable mass, retraction, or nipple discharge Cardiovascular: Regular rate and rhythm Abdomen:  Soft, non tender, no hepatosplenomegaly Pelvic:  External genitalia is normal in appearance.  The vagina is normal in appearance. The cervix is bulbous.  Uterus is felt to be normal size, shape, and contour.  No adnexal masses or tenderness noted. Extremities:  No swelling or varicosities noted Psych:  No mood changes, alert and cooperative, seems happy   Impression:  Yearly gyn exam no pap   Plan: Physical in 1 year Mammogram at 40 Labs at school

## 2013-11-13 ENCOUNTER — Other Ambulatory Visit (INDEPENDENT_AMBULATORY_CARE_PROVIDER_SITE_OTHER): Payer: Self-pay | Admitting: Internal Medicine

## 2013-11-15 ENCOUNTER — Encounter (INDEPENDENT_AMBULATORY_CARE_PROVIDER_SITE_OTHER): Payer: Self-pay | Admitting: Internal Medicine

## 2013-11-15 ENCOUNTER — Ambulatory Visit (INDEPENDENT_AMBULATORY_CARE_PROVIDER_SITE_OTHER): Payer: BC Managed Care – PPO | Admitting: Internal Medicine

## 2013-11-15 VITALS — BP 108/70 | HR 76 | Temp 97.5°F | Ht 65.0 in | Wt 147.5 lb

## 2013-11-15 DIAGNOSIS — K5010 Crohn's disease of large intestine without complications: Secondary | ICD-10-CM

## 2013-11-15 NOTE — Progress Notes (Signed)
Subjective:     Patient ID: Tamara Martin, female   DOB: 1976-05-14, 38 y.o.   MRN: [id number]  HPI Here today for follow up for her ileal and rectal Crohn's disease.  She was last seen in 2014 by Dr. Laural Golden. She was diagnosed with Crohn's 19 yrs ago by Dr. Laural Golden. Appetite is good. She has increased fiber in her diet. She tells me she feels good. Her bowels move every day. She does have gassy pain. No melena or bright red rectal bleeding.   bowels move daily. She denies abdominal pain melena or rectal bleeding . S . She is trying to increase fiber intake and also takes fiber gummy pill.  Patient's last colonoscopy was in December 2006 revealing proctitis. TI could not be examined. She had normal small bowel follow-through in August 2007.    Review of Systems  See hpi    Past Medical History  Diagnosis Date  . Crohn's disease of rectum 07/24/2010  . Rectal bleeding 07/24/2010  . Headache(784.0)   . Crohn's related arthritis     right knee  . Gout of big toe     right  . Fatigue 02/24/2013  . Irregular menses 02/24/2013  . Anxiety 03/04/2013    Past Surgical History  Procedure Laterality Date  . Colonoscopy  2006    Allergies  Allergen Reactions  . Penicillins Hives    Current Outpatient Prescriptions on File Prior to Visit  Medication Sig Dispense Refill  . b complex vitamins capsule Take 1 capsule by mouth daily.      . calcium-vitamin D (OSCAL WITH D) 500-200 MG-UNIT per tablet Take 1 tablet by mouth daily.      . cholecalciferol (VITAMIN D) 1000 UNITS tablet Take 1,000 Units by mouth every other day.      . cyclobenzaprine (FLEXERIL) 10 MG tablet TAKE 1 TABLET BY MOUTH EVERY 8 HOURS AS NEEDED FOR MIGRAINE  30 tablet  1  . FIBER SELECT GUMMIES PO Take 1 tablet by mouth daily.      . fish oil-omega-3 fatty acids 1000 MG capsule Take 1 g by mouth daily.      Marland Kitchen OVER THE COUNTER MEDICATION Take 1 tablet by mouth daily. COLON HEALTH: Supplement       . PENTASA 500 MG CR  capsule TAKE 2 CAPSULES BY MOUTH 4 TIMES A DAY  240 capsule  11  . PENTASA 500 MG CR capsule TAKE 2 CAPSULES BY MOUTH 4 TIMES A DAY  240 capsule  0  . sertraline (ZOLOFT) 25 MG tablet Take 1 tablet (25 mg total) by mouth daily.  30 tablet  6   No current facility-administered medications on file prior to visit.        Objective:   Physical Exam  Filed Vitals:   11/15/13 1523  BP: 108/70  Pulse: 76  Temp: 97.5 F (36.4 C)  Height: 5' 5"  (1.651 m)  Weight: 147 lb 8 oz (66.906 kg)   Alert and oriented. Skin warm and dry. Oral mucosa is moist.   . Sclera anicteric, conjunctivae is pink. Thyroid not enlarged. No cervical lymphadenopathy. Lungs clear. Heart regular rate and rhythm.  Abdomen is soft. Bowel sounds are positive. No hepatomegaly. No abdominal masses felt. No tenderness.  No edema to lower extremities.       Assessment:       Ileo-rectal Crohn's disease. Disease duration about 19 years. She has had very mild course. She is doing very well with oral mesalamine.  Seems to be in remission.     Plan:    She is doing well. OV in 1 yr. She will fax her labs to [place].

## 2013-11-15 NOTE — Patient Instructions (Signed)
OV in 1 yr.

## 2013-11-16 ENCOUNTER — Ambulatory Visit (INDEPENDENT_AMBULATORY_CARE_PROVIDER_SITE_OTHER): Payer: BC Managed Care – PPO | Admitting: Internal Medicine

## 2013-12-30 ENCOUNTER — Encounter (INDEPENDENT_AMBULATORY_CARE_PROVIDER_SITE_OTHER): Payer: Self-pay

## 2014-01-14 ENCOUNTER — Other Ambulatory Visit (INDEPENDENT_AMBULATORY_CARE_PROVIDER_SITE_OTHER): Payer: Self-pay | Admitting: Internal Medicine

## 2014-03-06 ENCOUNTER — Other Ambulatory Visit: Payer: Self-pay | Admitting: Adult Health

## 2014-03-20 ENCOUNTER — Encounter (HOSPITAL_COMMUNITY): Payer: Self-pay | Admitting: Emergency Medicine

## 2014-03-20 ENCOUNTER — Emergency Department (HOSPITAL_COMMUNITY)
Admission: EM | Admit: 2014-03-20 | Discharge: 2014-03-20 | Disposition: A | Discharge status: Home / Self Care | Payer: BC Managed Care – PPO | Attending: Emergency Medicine | Admitting: Emergency Medicine

## 2014-03-20 ENCOUNTER — Emergency Department (HOSPITAL_COMMUNITY): Payer: BC Managed Care – PPO

## 2014-03-20 DIAGNOSIS — Z8639 Personal history of other endocrine, nutritional and metabolic disease: Secondary | ICD-10-CM | POA: Insufficient documentation

## 2014-03-20 DIAGNOSIS — S298XXA Other specified injuries of thorax, initial encounter: Secondary | ICD-10-CM | POA: Diagnosis not present

## 2014-03-20 DIAGNOSIS — Z880 Allergy status to penicillin: Secondary | ICD-10-CM | POA: Diagnosis not present

## 2014-03-20 DIAGNOSIS — W010XXA Fall on same level from slipping, tripping and stumbling without subsequent striking against object, initial encounter: Secondary | ICD-10-CM | POA: Diagnosis not present

## 2014-03-20 DIAGNOSIS — Z79899 Other long term (current) drug therapy: Secondary | ICD-10-CM | POA: Diagnosis not present

## 2014-03-20 DIAGNOSIS — M0760 Enteropathic arthropathies, unspecified site: Secondary | ICD-10-CM | POA: Insufficient documentation

## 2014-03-20 DIAGNOSIS — R110 Nausea: Secondary | ICD-10-CM | POA: Insufficient documentation

## 2014-03-20 DIAGNOSIS — Y9389 Activity, other specified: Secondary | ICD-10-CM | POA: Diagnosis not present

## 2014-03-20 DIAGNOSIS — R0602 Shortness of breath: Secondary | ICD-10-CM | POA: Insufficient documentation

## 2014-03-20 DIAGNOSIS — K639 Disease of intestine, unspecified: Secondary | ICD-10-CM | POA: Insufficient documentation

## 2014-03-20 DIAGNOSIS — Z8742 Personal history of other diseases of the female genital tract: Secondary | ICD-10-CM | POA: Diagnosis not present

## 2014-03-20 DIAGNOSIS — K5010 Crohn's disease of large intestine without complications: Secondary | ICD-10-CM | POA: Diagnosis not present

## 2014-03-20 DIAGNOSIS — Y9289 Other specified places as the place of occurrence of the external cause: Secondary | ICD-10-CM | POA: Diagnosis not present

## 2014-03-20 DIAGNOSIS — F411 Generalized anxiety disorder: Secondary | ICD-10-CM | POA: Diagnosis not present

## 2014-03-20 DIAGNOSIS — Z862 Personal history of diseases of the blood and blood-forming organs and certain disorders involving the immune mechanism: Secondary | ICD-10-CM | POA: Diagnosis not present

## 2014-03-20 DIAGNOSIS — R079 Chest pain, unspecified: Secondary | ICD-10-CM

## 2014-03-20 MED ORDER — ONDANSETRON 4 MG PO TBDP
8.0000 mg | ORAL_TABLET | Freq: Once | ORAL | Status: AC
  Administered 2014-03-20: 8 mg via ORAL
  Filled 2014-03-20: qty 2

## 2014-03-20 MED ORDER — ONDANSETRON HCL 4 MG PO TABS: 4.0000 mg | ORAL_TABLET | Freq: Four times a day (QID) | ORAL | Status: DC

## 2014-03-20 MED ORDER — HYDROCODONE-ACETAMINOPHEN 5-325 MG PO TABS: 1.0000 | ORAL_TABLET | Freq: Four times a day (QID) | ORAL | Status: DC | PRN

## 2014-03-20 MED ORDER — HYDROCODONE-ACETAMINOPHEN 5-325 MG PO TABS
2.0000 | ORAL_TABLET | Freq: Once | ORAL | Status: AC
  Administered 2014-03-20: 2 via ORAL
  Filled 2014-03-20: qty 2

## 2014-03-20 MED ORDER — HYDROCODONE-ACETAMINOPHEN 5-325 MG PO TABS
1.0000 | ORAL_TABLET | Freq: Four times a day (QID) | ORAL | Status: DC | PRN
Start: 2014-03-20 — End: 2015-01-10

## 2014-03-20 NOTE — ED Provider Notes (Signed)
CSN: [id number]     Arrival date & time 03/20/14  1430 History   First MD Initiated Contact with Patient 03/20/14 1607     Chief Complaint  Patient presents with  . Chest Injury  . Fall     (Consider location/radiation/quality/duration/timing/severity/associated sxs/prior Treatment) HPI Comments: Patient is a 38 year old female with history of Crohn's disease, gout, and anxiety who presents to the emergency department today after a fall. She reports around 2PM she was playing with her son and fell. She fell on her fist, on her sternum. She has had pain in her sternum since this time. Initially she felt short of breath, but now is having pain with inspiration. No hemoptysis. The patient denies hitting her head, LOC, or any other injuries. Her hand does not hurt from where she fell. She has not taken anything to improve her pain and denies pain medication here.   Patient is a 38 y.o. female presenting with fall. The history is provided by the patient. No language interpreter was used.  Fall Associated symptoms include chest pain and nausea. Pertinent negatives include no chills, fever or vomiting.    Past Medical History  Diagnosis Date  . Crohn's disease of rectum 07/24/2010  . Rectal bleeding 07/24/2010  . Headache(784.0)   . Crohn's related arthritis     right knee  . Gout of big toe     right  . Fatigue 02/24/2013  . Irregular menses 02/24/2013  . Anxiety 03/04/2013   Past Surgical History  Procedure Laterality Date  . Colonoscopy  2006   Family History  Problem Relation Age of Onset  . Atrial fibrillation Mother   . Cirrhosis Father   . Cancer Father     stomach and bone  . Healthy Brother   . Healthy Brother   . Healthy Son   . Healthy Son   . Healthy Daughter   . Cancer Maternal Aunt     lung  . Cancer Maternal Uncle     bones  . Diabetes Maternal Grandmother   . Hypertension Maternal Grandmother   . Diabetes Maternal Grandfather    History  Substance Use  Topics  . Smoking status: Never Smoker   . Smokeless tobacco: Never Used  . Alcohol Use: No   OB History   Grav Para Term Preterm Abortions TAB SAB Ect Mult Living   4 3   1  1   3      Review of Systems  Constitutional: Negative for fever and chills.  Respiratory: Negative for shortness of breath.   Cardiovascular: Positive for chest pain.  Gastrointestinal: Positive for nausea. Negative for vomiting.  All other systems reviewed and are negative.     Allergies  Penicillins  Home Medications   Prior to Admission medications   Medication Sig Start Date End Date Taking? Authorizing Provider  b complex vitamins capsule Take 1 capsule by mouth daily.    Historical Provider, MD  calcium-vitamin D (OSCAL WITH D) 500-200 MG-UNIT per tablet Take 1 tablet by mouth daily.    Historical Provider, MD  cholecalciferol (VITAMIN D) 1000 UNITS tablet Take 1,000 Units by mouth every other day.    Historical Provider, MD  cyclobenzaprine (FLEXERIL) 10 MG tablet TAKE 1 TABLET BY MOUTH EVERY 8 HOURS AS NEEDED FOR MIGRAINE 09/14/13   Estill Dooms, NP  FIBER SELECT GUMMIES PO Take 1 tablet by mouth daily.    Historical Provider, MD  fish oil-omega-3 fatty acids 1000 MG capsule Take  1 g by mouth daily.    Historical Provider, MD  OVER THE COUNTER MEDICATION Take 1 tablet by mouth daily. COLON HEALTH: Supplement     Historical Provider, MD  PENTASA 500 MG CR capsule TAKE 2 CAPSULES BY MOUTH 4 TIMES A DAY 08/29/12   Rogene Houston, MD  PENTASA 500 MG CR capsule TAKE 2 CAPSULES BY MOUTH 4 TIMES A DAY    Terri L Setzer, NP  sertraline (ZOLOFT) 25 MG tablet TAKE 1 TABLET (25 MG TOTAL) BY MOUTH DAILY. 03/07/14   Estill Dooms, NP   BP 113/64  Pulse 92  Temp(Src) 98.5 F (36.9 C) (Oral)  Resp 12  Ht 5' 5"  (1.651 m)  Wt 146 lb (66.225 kg)  BMI 24.30 kg/m2  SpO2 100%  LMP 03/12/2014 Physical Exam  Nursing note and vitals reviewed. Constitutional: She is oriented to person, place, and time.  She appears well-developed and well-nourished. No distress.  HENT:  Head: Normocephalic and atraumatic.  Right Ear: External ear normal.  Left Ear: External ear normal.  Nose: Nose normal.  Mouth/Throat: Oropharynx is clear and moist.  Eyes: Conjunctivae and EOM are normal. Pupils are equal, round, and reactive to light.  Neck: Normal range of motion.  Cardiovascular: Normal rate, regular rhythm, normal heart sounds, intact distal pulses and normal pulses.   Pulses:      Radial pulses are 2+ on the right side, and 2+ on the left side.       Posterior tibial pulses are 2+ on the right side, and 2+ on the left side.  Pulmonary/Chest: Effort normal and breath sounds normal. No stridor. No respiratory distress. She has no wheezes. She has no rales. She exhibits tenderness and bony tenderness.    No bruising   Abdominal: Soft. She exhibits no distension.  Musculoskeletal: Normal range of motion.  Neurological: She is alert and oriented to person, place, and time. She has normal strength. GCS eye subscore is 4. GCS verbal subscore is 5. GCS motor subscore is 6.  Skin: Skin is warm and dry. She is not diaphoretic. No erythema.  Psychiatric: She has a normal mood and affect. Her behavior is normal.    ED Course  Procedures (including critical care time) Labs Review Labs Reviewed - No data to display  Imaging Review Dg Chest 2 View  03/20/2014   CLINICAL DATA:  Fall.  EXAM: CHEST  2 VIEW  COMPARISON:  05/19/2012  FINDINGS: The heart size and mediastinal contours are within normal limits. Both lungs are clear. The visualized skeletal structures are unremarkable.  IMPRESSION: No active cardiopulmonary disease.   Electronically Signed   By: Kerby Moors M.D.   On: 03/20/2014 15:42     EKG Interpretation None      MDM   Final diagnoses:  Chest pain, unspecified chest pain type    Patient presents to ED with chest pain after falling on her fist. No LOC or other injuries. Neuro exam  normal. Patient with normal chest XR and EKG. Relatively minor mechanism. No bruising. Patient given pain medication and discussed reasons to return to ED immediately. Vital signs stable for discharge. Patient / Family / Caregiver informed of clinical course, understand medical decision-making process, and agree with plan.   Elwyn Lade, PA-C 03/20/14 [phone]

## 2014-03-20 NOTE — ED Provider Notes (Signed)
Medical screening examination/treatment/procedure(s) were performed by non-physician practitioner and as supervising physician I was immediately available for consultation/collaboration.   EKG Interpretation None       Jasper Riling. Alvino Chapel, MD 03/20/14 [phone]

## 2014-03-20 NOTE — Discharge Instructions (Signed)

## 2014-03-20 NOTE — ED Notes (Signed)
Pt reports tripping and falling forward, landed with her fist on her sternum area, it "knocked the breath out of her" and pt felt lightheaded and nauseated. No acute distress noted at triage, ekg done.

## 2014-03-20 NOTE — ED Notes (Signed)
Pt from home, states she was chasing her nephew when she fell on her fist and "got the wind knocked out of me." Pt now reports cp and tenderness, pt denies any n/v/d. Pt denies any cardiac history. Axo . Family at bedside.

## 2014-05-09 ENCOUNTER — Encounter (HOSPITAL_COMMUNITY): Payer: Self-pay | Admitting: Emergency Medicine

## 2014-07-11 ENCOUNTER — Ambulatory Visit (INDEPENDENT_AMBULATORY_CARE_PROVIDER_SITE_OTHER): Payer: BC Managed Care – PPO | Admitting: Internal Medicine

## 2014-08-18 ENCOUNTER — Encounter (INDEPENDENT_AMBULATORY_CARE_PROVIDER_SITE_OTHER): Payer: Self-pay | Admitting: *Deleted

## 2015-01-03 ENCOUNTER — Telehealth (INDEPENDENT_AMBULATORY_CARE_PROVIDER_SITE_OTHER): Payer: Self-pay | Admitting: *Deleted

## 2015-01-03 ENCOUNTER — Encounter (INDEPENDENT_AMBULATORY_CARE_PROVIDER_SITE_OTHER): Payer: Self-pay | Admitting: Internal Medicine

## 2015-01-03 NOTE — Telephone Encounter (Signed)
This was rec'd in patient  Advice today.  I would like to reschedule my appt. scheduled for 01/10/15.

## 2015-01-04 NOTE — Telephone Encounter (Signed)
Patient said she was going to try and keep the apt since it was with Dr. Laural Golden.

## 2015-01-10 ENCOUNTER — Encounter (INDEPENDENT_AMBULATORY_CARE_PROVIDER_SITE_OTHER): Payer: Self-pay | Admitting: Internal Medicine

## 2015-01-10 ENCOUNTER — Telehealth (INDEPENDENT_AMBULATORY_CARE_PROVIDER_SITE_OTHER): Payer: Self-pay | Admitting: *Deleted

## 2015-01-10 ENCOUNTER — Ambulatory Visit (INDEPENDENT_AMBULATORY_CARE_PROVIDER_SITE_OTHER): Payer: BC Managed Care – PPO | Admitting: Internal Medicine

## 2015-01-10 VITALS — BP 112/72 | HR 76 | Temp 98.1°F | Resp 18 | Ht 65.0 in | Wt 158.2 lb

## 2015-01-10 DIAGNOSIS — R1013 Epigastric pain: Secondary | ICD-10-CM

## 2015-01-10 DIAGNOSIS — K50111 Crohn's disease of large intestine with rectal bleeding: Secondary | ICD-10-CM

## 2015-01-10 DIAGNOSIS — E559 Vitamin D deficiency, unspecified: Secondary | ICD-10-CM | POA: Diagnosis not present

## 2015-01-10 DIAGNOSIS — K5010 Crohn's disease of large intestine without complications: Secondary | ICD-10-CM

## 2015-01-10 MED ORDER — VITAMIN D (ERGOCALCIFEROL) 1.25 MG (50000 UNIT) PO CAPS: 50000.0000 [IU] | ORAL_CAPSULE | ORAL | Status: DC

## 2015-01-10 MED ORDER — MESALAMINE 1000 MG RE SUPP: 1000.0000 mg | Freq: Every day | RECTAL | Status: DC

## 2015-01-10 NOTE — Progress Notes (Signed)
Presenting complaint;  Follow-up for Crohn's disease. Patient complains of epigastric pain.  Subjective:  Patient is 39 year old Caucasian female was 77 or history of ileorectal Crohn's disease was here for scheduled visit. She was last seen in May 2015. She complains of epigastric pain which started earlier today. She denies nausea vomiting or heartburn. She also denies diarrhea fever or chills. She has noted some heartburn today. Her bowels move once a day she rarely has diarrhea. Over the last 2 weeks she has noted blood with mixed with stool and she also has noted urgency and when she goes to the bathroom or she passes his mucus and flatus. She remains with good appetite. She has gained 11 pounds in the last 13 months. She has not taken any antibiotics recently. She does not take NSAIDs. She has not traveled outside the country recently. She had blood work through school system and brought along copy for review today.   Current Medications: Outpatient Encounter Prescriptions as of 01/10/2015  Medication Sig  . b complex vitamins capsule Take 1 capsule by mouth daily.  . calcium-vitamin D (OSCAL WITH D) 500-200 MG-UNIT per tablet Take 1 tablet by mouth daily.  Marland Kitchen FIBER SELECT GUMMIES PO Take 1 tablet by mouth daily.  . mesalamine (PENTASA) 500 MG CR capsule Take 1,000 mg by mouth 4 (four) times daily.  Marland Kitchen OVER THE COUNTER MEDICATION Take 1 tablet by mouth daily. COLON HEALTH: Supplement   . sertraline (ZOLOFT) 25 MG tablet Take 25 mg by mouth at bedtime.  . [DISCONTINUED] cholecalciferol (VITAMIN D) 1000 UNITS tablet Take 1,000 Units by mouth every other day.  . [DISCONTINUED] fish oil-omega-3 fatty acids 1000 MG capsule Take 1 g by mouth daily.  . [DISCONTINUED] HYDROcodone-acetaminophen (NORCO/VICODIN) 5-325 MG per tablet Take 1-2 tablets by mouth every 6 (six) hours as needed for moderate pain or severe pain. (Patient not taking: Reported on 01/10/2015)  . [DISCONTINUED] ondansetron (ZOFRAN)  4 MG tablet Take 1 tablet (4 mg total) by mouth every 6 (six) hours. (Patient not taking: Reported on 01/10/2015)   No facility-administered encounter medications on file as of 01/10/2015.     Objective: Blood pressure 112/72, pulse 76, temperature 98.1 F (36.7 C), temperature source Oral, resp. rate 18, height 5' 5"  (1.651 m), weight 158 lb 3.2 oz (71.759 kg), last menstrual period 12/14/2014. Patient is alert and in no acute distress. Conjunctiva is pink. Sclera is nonicteric Oropharyngeal mucosa is normal. No neck masses or thyromegaly noted. Cardiac exam with regular rhythm normal S1 and S2. No murmur or gallop noted. Lungs are clear to auscultation. Abdomen is symmetrical. Bowel sounds are normal. On palpation abdomen is soft with mild tenderness in epigastrium and. Blackened region without guarding or rebound. No organomegaly or masses.  No LE edema or clubbing noted.  Labs/studies Results:   lab data from October 26, 2014 WBC 6.5, H&H 12.8 and 39.5 and platelet count 274K vit D level 27.6 Glucose 80, BUN 11, creatinine 0.68, serum sodium 141, potassium 4.2, chloride 101, CO2 26, serum calcium 9.3, bilirubin 0.3, AP 79, AST 13, ALT 13, total protein 6.9 and albumin 4.4.   Assessment:  #1. History of ileal and rectal Crohn's disease diagnosed 20 years ago. Last colonoscopy was 10 years ago revealing residual proctitis. Now she is having symptoms suggestive of relapse of rectal disease which could've been triggered by stress at work. If she does not respond to topical mesalamine would consider endoscopic evaluation which she should have in any case given that last  exam was 10 years ago. #2. Epigastric pain of recent onset. Suspect separate issue possibly food borne illness. If she does not feel better over the next few days she will need further evaluation. #3. Vitamin D deficiency.   Plan:  Canasa suppository 1 g per rectum daily at bedtime for one month. Patient advised to call office  if epigastric pain not resolve by the end of the week or if she develops nausea vomiting fever or diarrhea. Vitamin D 50,000 units by mouth every week. Patient will keep symptom diary and call with progress report in one month. Vitamin D level in 3 months. Office visit in 6 months.

## 2015-01-10 NOTE — Telephone Encounter (Signed)
Per Dr.Rehman the patient will need to have labs drawn in 3 months 

## 2015-01-10 NOTE — Patient Instructions (Signed)
Please keep symptom diary and call with progress report in one month. Will vitamin D level in 3 months.

## 2015-01-17 ENCOUNTER — Encounter (INDEPENDENT_AMBULATORY_CARE_PROVIDER_SITE_OTHER): Payer: Self-pay

## 2015-02-08 ENCOUNTER — Other Ambulatory Visit: Payer: Self-pay | Admitting: Adult Health

## 2015-02-28 ENCOUNTER — Other Ambulatory Visit (INDEPENDENT_AMBULATORY_CARE_PROVIDER_SITE_OTHER): Payer: Self-pay | Admitting: Internal Medicine

## 2015-03-04 ENCOUNTER — Other Ambulatory Visit (INDEPENDENT_AMBULATORY_CARE_PROVIDER_SITE_OTHER): Payer: Self-pay | Admitting: Internal Medicine

## 2015-03-28 ENCOUNTER — Other Ambulatory Visit (INDEPENDENT_AMBULATORY_CARE_PROVIDER_SITE_OTHER): Payer: Self-pay | Admitting: *Deleted

## 2015-03-28 ENCOUNTER — Encounter (INDEPENDENT_AMBULATORY_CARE_PROVIDER_SITE_OTHER): Payer: Self-pay | Admitting: *Deleted

## 2015-03-28 DIAGNOSIS — E559 Vitamin D deficiency, unspecified: Secondary | ICD-10-CM

## 2015-03-28 DIAGNOSIS — K5010 Crohn's disease of large intestine without complications: Secondary | ICD-10-CM

## 2015-04-05 ENCOUNTER — Encounter (INDEPENDENT_AMBULATORY_CARE_PROVIDER_SITE_OTHER): Payer: Self-pay | Admitting: Internal Medicine

## 2015-05-17 ENCOUNTER — Encounter (INDEPENDENT_AMBULATORY_CARE_PROVIDER_SITE_OTHER): Payer: Self-pay | Admitting: *Deleted

## 2015-06-09 ENCOUNTER — Other Ambulatory Visit (INDEPENDENT_AMBULATORY_CARE_PROVIDER_SITE_OTHER): Payer: Self-pay | Admitting: Internal Medicine

## 2015-07-13 ENCOUNTER — Telehealth (INDEPENDENT_AMBULATORY_CARE_PROVIDER_SITE_OTHER): Payer: Self-pay | Admitting: *Deleted

## 2015-07-13 ENCOUNTER — Other Ambulatory Visit (INDEPENDENT_AMBULATORY_CARE_PROVIDER_SITE_OTHER): Payer: Self-pay | Admitting: Internal Medicine

## 2015-07-13 ENCOUNTER — Ambulatory Visit (INDEPENDENT_AMBULATORY_CARE_PROVIDER_SITE_OTHER): Payer: BC Managed Care – PPO | Admitting: Internal Medicine

## 2015-07-13 ENCOUNTER — Encounter (INDEPENDENT_AMBULATORY_CARE_PROVIDER_SITE_OTHER): Payer: Self-pay | Admitting: Internal Medicine

## 2015-07-13 VITALS — BP 108/70 | HR 72 | Temp 98.9°F | Ht 65.0 in | Wt 166.6 lb

## 2015-07-13 DIAGNOSIS — K5000 Crohn's disease of small intestine without complications: Secondary | ICD-10-CM | POA: Diagnosis not present

## 2015-07-13 DIAGNOSIS — Z1211 Encounter for screening for malignant neoplasm of colon: Secondary | ICD-10-CM

## 2015-07-13 NOTE — Telephone Encounter (Signed)
Per terri.

## 2015-07-13 NOTE — Telephone Encounter (Signed)
patient needs trilyte

## 2015-07-13 NOTE — Patient Instructions (Signed)
Colonoscopy. Labs today.

## 2015-07-13 NOTE — Progress Notes (Signed)
   Subjective:    Patient ID: Tamara Martin, female    DOB: October 03, 1975, 40 y.o.   MRN: [id number]   HPI 33 year hx of ileorectal Crohn's. She is here for a scheduled visit. She says she she has seen some blood in her stools. Has been occurring about 3-4 weeks.  No diarrhea. No urgency.  She is passing some mucous.  Stools are formed.  Her appetite is good. She has gained 8 pounds since her lat visit in July.  She has not had any recent blood work.  Patient's last colonoscopy was in December 2006 revealing proctitis. TI could not be examined. No mass or polyp seen. She had normal small bowel follow-through in August 2007.        Review of Systems Past Medical History  Diagnosis Date  . Crohn's disease of rectum ([place]) 07/24/2010  . Rectal bleeding 07/24/2010  . Headache(784.0)   . 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 Gout of big toe     right  . Fatigue 02/24/2013  . Irregular menses 02/24/2013  . Anxiety 03/04/2013    Past Surgical History  Procedure Laterality Date  . Colonoscopy  2006    Allergies  Allergen Reactions  . Penicillins Hives    Current Outpatient Prescriptions on File Prior to Visit  Medication Sig Dispense Refill  . b complex vitamins capsule Take 1 capsule by mouth daily.    . calcium-vitamin D (OSCAL WITH D) 500-200 MG-UNIT per tablet Take 1 tablet by mouth daily.    Marland Kitchen FIBER SELECT GUMMIES PO Take 1 tablet by mouth daily.    . mesalamine (PENTASA) 500 MG CR capsule Take 1,000 mg by mouth 4 (four) times daily.    Marland Kitchen OVER THE COUNTER MEDICATION Take 1 tablet by mouth daily. COLON HEALTH: Supplement     . PENTASA 500 MG CR capsule TAKE 2 CAPSULES BY MOUTH 4 TIMES A DAY 240 capsule 5  . sertraline (ZOLOFT) 25 MG tablet TAKE 1 TABLET (25 MG TOTAL) BY MOUTH DAILY. 30 tablet 5  . Vitamin D, Ergocalciferol, (DRISDOL) 50000 UNITS CAPS capsule TAKE 1 CAPSULE (50,000 UNITS TOTAL) BY MOUTH EVERY 7 (SEVEN) DAYS. 30 capsule 0  . mesalamine (CANASA)  1000 MG suppository Place 1 suppository (1,000 mg total) rectally at bedtime. (Patient not taking: Reported on 07/13/2015) 30 suppository 1   No current facility-administered medications on file prior to visit.        Objective:   Physical ExamBlood pressure 108/70, pulse 72, temperature 98.9 F (37.2 C), height 5' 5"  (1.651 m), weight 166 lb 9.6 oz (75.569 kg). Alert and oriented. Skin warm and dry. Oral mucosa is moist.   . Sclera anicteric, conjunctivae is pink. Thyroid not enlarged. No cervical lymphadenopathy. Lungs clear. Heart regular rate and rhythm.  Abdomen is soft. Bowel sounds are positive. No hepatomegaly. No abdominal masses felt. No tenderness.  No edema to lower extremities.         Assessment & Plan:   1. History of ileal and rectal Crohn's disease diagnosed 20 years ago. Last colonoscopy was 10 years ago revealing residual proctitis. She has seen some rectal bleeding over the pat couple of weeks.   Will start Canasa supp 1 gam per rectum at hs x 1 month. Will schedule a colonosocopy. CBC, Sedrate,  Vitamin D deficiency: Vitamin D Level.

## 2015-07-14 LAB — CBC WITH DIFFERENTIAL/PLATELET
BASOS ABS: 0 10*3/uL (ref 0.0–0.1)
Basophils Relative: 0 % (ref 0–1)
Eosinophils Absolute: 0.1 10*3/uL (ref 0.0–0.7)
Eosinophils Relative: 1 % (ref 0–5)
HEMATOCRIT: 39.1 % (ref 36.0–46.0)
HEMOGLOBIN: 13 g/dL (ref 12.0–15.0)
LYMPHS PCT: 23 % (ref 12–46)
Lymphs Abs: 2 10*3/uL (ref 0.7–4.0)
MCH: 29 pg (ref 26.0–34.0)
MCHC: 33.2 g/dL (ref 30.0–36.0)
MCV: 87.3 fL (ref 78.0–100.0)
MPV: 9.9 fL (ref 8.6–12.4)
Monocytes Absolute: 0.7 10*3/uL (ref 0.1–1.0)
Monocytes Relative: 8 % (ref 3–12)
NEUTROS ABS: 6.1 10*3/uL (ref 1.7–7.7)
NEUTROS PCT: 68 % (ref 43–77)
Platelets: 285 10*3/uL (ref 150–400)
RBC: 4.48 MIL/uL (ref 3.87–5.11)
RDW: 13.8 % (ref 11.5–15.5)
WBC: 8.9 10*3/uL (ref 4.0–10.5)

## 2015-07-14 LAB — SEDIMENTATION RATE: SED RATE: 10 mm/h (ref 0–20)

## 2015-07-14 LAB — VITAMIN D 25 HYDROXY (VIT D DEFICIENCY, FRACTURES): VIT D 25 HYDROXY: 35 ng/mL (ref 30–100)

## 2015-07-14 MED ORDER — PEG 3350-KCL-NA BICARB-NACL 420 G PO SOLR: 4000.0000 mL | Freq: Once | ORAL | Status: DC

## 2015-08-10 ENCOUNTER — Encounter (HOSPITAL_COMMUNITY): Payer: Self-pay | Admitting: *Deleted

## 2015-08-10 ENCOUNTER — Encounter (HOSPITAL_COMMUNITY)
Admission: RE | Disposition: A | Discharge status: Home Health | Payer: Self-pay | Source: Ambulatory Visit | Attending: Internal Medicine

## 2015-08-10 ENCOUNTER — Ambulatory Visit (HOSPITAL_COMMUNITY)
Admission: RE | Admit: 2015-08-10 | Discharge: 2015-08-10 | Disposition: A | Discharge status: Home Health | Payer: BC Managed Care – PPO | Source: Ambulatory Visit | Attending: Internal Medicine | Admitting: Internal Medicine

## 2015-08-10 DIAGNOSIS — M109 Gout, unspecified: Secondary | ICD-10-CM | POA: Diagnosis not present

## 2015-08-10 DIAGNOSIS — F419 Anxiety disorder, unspecified: Secondary | ICD-10-CM | POA: Diagnosis not present

## 2015-08-10 DIAGNOSIS — K644 Residual hemorrhoidal skin tags: Secondary | ICD-10-CM | POA: Insufficient documentation

## 2015-08-10 DIAGNOSIS — M07661 Enteropathic arthropathies, right knee: Secondary | ICD-10-CM | POA: Insufficient documentation

## 2015-08-10 DIAGNOSIS — K5090 Crohn's disease, unspecified, without complications: Secondary | ICD-10-CM | POA: Diagnosis not present

## 2015-08-10 DIAGNOSIS — K6289 Other specified diseases of anus and rectum: Secondary | ICD-10-CM | POA: Insufficient documentation

## 2015-08-10 DIAGNOSIS — Z79899 Other long term (current) drug therapy: Secondary | ICD-10-CM | POA: Diagnosis not present

## 2015-08-10 DIAGNOSIS — K625 Hemorrhage of anus and rectum: Secondary | ICD-10-CM | POA: Insufficient documentation

## 2015-08-10 DIAGNOSIS — Z808 Family history of malignant neoplasm of other organs or systems: Secondary | ICD-10-CM | POA: Diagnosis not present

## 2015-08-10 DIAGNOSIS — Z801 Family history of malignant neoplasm of trachea, bronchus and lung: Secondary | ICD-10-CM | POA: Insufficient documentation

## 2015-08-10 HISTORY — PX: COLONOSCOPY: SHX5424

## 2015-08-10 SURGERY — COLONOSCOPY
Anesthesia: Moderate Sedation

## 2015-08-10 MED ORDER — MIDAZOLAM HCL 5 MG/5ML IJ SOLN
INTRAMUSCULAR | Status: DC | PRN
  Administered 2015-08-10: 3 mg via INTRAVENOUS
  Administered 2015-08-10: 2 mg via INTRAVENOUS
  Administered 2015-08-10 (×2): 1 mg via INTRAVENOUS
  Administered 2015-08-10: 2 mg via INTRAVENOUS

## 2015-08-10 MED ORDER — STERILE WATER FOR IRRIGATION IR SOLN
Status: DC | PRN
  Administered 2015-08-10: 14:00:00

## 2015-08-10 MED ORDER — MIDAZOLAM HCL 5 MG/5ML IJ SOLN
INTRAMUSCULAR | Status: AC
  Filled 2015-08-10: qty 10

## 2015-08-10 MED ORDER — MEPERIDINE HCL 50 MG/ML IJ SOLN
INTRAMUSCULAR | Status: DC | PRN
  Administered 2015-08-10 (×2): 25 mg via INTRAVENOUS

## 2015-08-10 MED ORDER — MEPERIDINE HCL 50 MG/ML IJ SOLN
INTRAMUSCULAR | Status: DC
Start: 2015-08-10 — End: 2015-08-10
  Filled 2015-08-10: qty 1

## 2015-08-10 MED ORDER — SODIUM CHLORIDE 0.9 % IV SOLN
INTRAVENOUS | Status: DC | PRN
  Administered 2015-08-10: 500 mL via INTRAMUSCULAR

## 2015-08-10 NOTE — Discharge Instructions (Signed)
Resume usual medications and diet. Continue Canasa suppositories 1 per rectum daily at bedtime for 4 more weeks and thereafter every weekend. No driving for 24 hours. Office visit in 6 months.  Colonoscopy, Care After Refer to this sheet in the next few weeks. These instructions provide you with information on caring for yourself after your procedure. Your health care provider may also give you more specific instructions. Your treatment has been planned according to current medical practices, but problems sometimes occur. Call your health care provider if you have any problems or questions after your procedure. WHAT TO EXPECT AFTER THE PROCEDURE  After your procedure, it is typical to have the following:  A small amount of blood in your stool.  Moderate amounts of gas and mild abdominal cramping or bloating. HOME CARE INSTRUCTIONS  Do not drive, operate machinery, or sign important documents for 24 hours.  You may shower and resume your regular physical activities, but move at a slower pace for the first 24 hours.  Take frequent rest periods for the first 24 hours.  Walk around or put a warm pack on your abdomen to help reduce abdominal cramping and bloating.  Drink enough fluids to keep your urine clear or pale yellow.  You may resume your normal diet as instructed by your health care provider. Avoid heavy or fried foods that are hard to digest.  Avoid drinking alcohol for 24 hours or as instructed by your health care provider.  Only take over-the-counter or prescription medicines as directed by your health care provider.  If a tissue sample (biopsy) was taken during your procedure:  Do not take aspirin or blood thinners for 7 days, or as instructed by your health care provider.  Do not drink alcohol for 7 days, or as instructed by your health care provider.  Eat soft foods for the first 24 hours. SEEK MEDICAL CARE IF: You have persistent spotting of blood in your stool 2-3  days after the procedure. SEEK IMMEDIATE MEDICAL CARE IF:  You have more than a small spotting of blood in your stool.  You pass large blood clots in your stool.  Your abdomen is swollen (distended).  You have nausea or vomiting.  You have a fever.  You have increasing abdominal pain that is not relieved with medicine.   This information is not intended to replace advice given to you by your health care provider. Make sure you discuss any questions you have with your health care provider.   Proctitis Proctitis is swelling and soreness (inflammation) of the lining of the rectum. The rectum is at the end of the large intestine, and it leads to the anus. The inflammation causes pain and discomfort. It may be a short-term (acute) or long-lasting (chronic) problem. CAUSES This condition may be caused by:  STDs (sexually transmitted diseases).  Infection.  Trauma or injury to the anus or rectum.  Ulcerative colitis or Crohn disease.  Radiation therapy that is directed near the rectum.  Antibiotic therapy. SYMPTOMS Symptoms of this condition include:  Sudden, uncomfortable, and frequent urge to have a bowel movement.  Anal pain or rectal pain.  Pain or cramping in the abdomen.  Sensation that the rectum is full.  Rectal bleeding.  Pus or mucus discharge from the anus.  Diarrhea or frequent soft, loose stools.  Constipation.  Pain with bowel movements. DIAGNOSIS This condition may be diagnosed based on:  A medical history and physical exam.  Various tests, such as:  An STD test.  Blood tests.  Stool tests.  Rectal culture.  A procedure to evaluate the anal canal (anoscopy).  Procedures to look at the entire large bowel or part of it (colonoscopy or sigmoidoscopy). TREATMENT Treatment for this condition depends on the cause. The main goals of treatment are to reduce the symptoms of inflammation and to get rid of any infection. Treatment may  include:  Home remedies and lifestyle changes, such as sitz baths and avoiding food right before bedtime.  Medicines, such as:  Topical ointments, foams, suppositories, or enemas, such as corticosteroids or anti-inflammatories.  Antibiotic or antiviral medicines to treat infection or to control harmful bacteria.  Medicines to control diarrhea, soften stools, and reduce pain.  Medicines to suppress the immune system.  Nutritional, dietary, or herbal supplements.  Avoiding the activity that caused rectal trauma.  Heat or laser therapy for persistent bleeding.  A dilation procedure to enlarge a narrowed rectum.  Surgery to repair damaged rectal lining. This is rare. If your proctitis was caused by an STD, your health care provider may test you for infection again 3 months after treatment. HOME CARE INSTRUCTIONS  Take over-the-counter and prescription medicines only as told by your health care provider.  If you were prescribed an antibiotic medicine, take it as told by your health care provider. Do not stop taking the antibiotic even if you start to feel better.  Try to avoid eating right before bedtime.  Take sitz baths as told by your health care provider.  Keep all follow-up visits as told by your health care provider. This is important. SEEK MEDICAL CARE IF:  Your symptoms do not improve with treatment.  Your symptoms get worse.  You have a fever.   This information is not intended to replace advice given to you by your health care provider. Make sure you discuss any questions you have with your health care provider.

## 2015-08-10 NOTE — H&P (Signed)
Tamara Martin is an 40 y.o. female.   Chief Complaint:  Patient is here for colonoscopy. HPI:  Patient is 40 year old Caucasian female who is 20 year history of Crohn's disease involving ileum and rectum who recently noted rectal bleeding. She is on oral mesalamine. She is treated with Canasa suppositories and bleeding has stopped. She denies diarrhea abdominal pain. Last colonoscopy was 10 years ago. She is undergoing diagnostic /surveillance colonoscopy.  Family history is negative  For IBD and CRC.  Past Medical History  Diagnosis Date  . Crohn's disease of rectum ([place]) 07/24/2010  . Rectal bleeding 07/24/2010  . Headache(784.0)   . 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 Gout of big toe     right  . Fatigue 02/24/2013  . Irregular menses 02/24/2013  . Anxiety 03/04/2013    Past Surgical History  Procedure Laterality Date  . Colonoscopy  2006    Family History  Problem Relation Age of Onset  . Atrial fibrillation Mother   . Cirrhosis Father   . Cancer Father     stomach and bone  . Healthy Brother   . Healthy Brother   . Healthy Son   . Healthy Son   . Healthy Daughter   . Cancer Maternal Aunt     lung  . Cancer Maternal Uncle     bones  . Diabetes Maternal Grandmother   . Hypertension Maternal Grandmother   . Diabetes Maternal Grandfather    Social History:  reports that she has never smoked. She has never used smokeless tobacco. She reports that she does not drink alcohol or use illicit drugs.  Allergies:  Allergies  Allergen Reactions  . Penicillins Hives    Has patient had a PCN reaction causing immediate rash, facial/tongue/throat swelling, SOB or lightheadedness with hypotension: Yes Has patient had a PCN reaction causing severe rash involving mucus membranes or skin necrosis: No Has patient had a PCN reaction that required hospitalization Yes Has patient had a PCN reaction occurring within the last 10 years: No If all of the above answers are  "NO", then may proceed with Cephalosporin use.     Medications Prior to Admission  Medication Sig Dispense Refill  . b complex vitamins capsule Take 1 capsule by mouth daily.    . calcium-vitamin D (OSCAL WITH D) 500-200 MG-UNIT per tablet Take 1 tablet by mouth daily.    Marland Kitchen FIBER SELECT GUMMIES PO Take 1 tablet by mouth daily.    . mesalamine (CANASA) 1000 MG suppository Place 1 suppository (1,000 mg total) rectally at bedtime. (Patient taking differently: Place 1,000 mg rectally at bedtime as needed (blood in stool). ) 30 suppository 1  . Multiple Vitamins-Minerals (MULTI ADULT GUMMIES PO) Take by mouth daily.    Marland Kitchen OVER THE COUNTER MEDICATION Take 1 tablet by mouth daily. COLON HEALTH: Supplement     . PENTASA 500 MG CR capsule TAKE 2 CAPSULES BY MOUTH 4 TIMES A DAY 240 capsule 5  . polyethylene glycol-electrolytes (NULYTELY/GOLYTELY) 420 g solution Take 4,000 mLs by mouth once. 4000 mL 0  . sertraline (ZOLOFT) 25 MG tablet TAKE 1 TABLET (25 MG TOTAL) BY MOUTH DAILY. (Patient taking differently: TAKE ONE-HALF TABLET (12.5 MG) BY MOUTH DAILY.) 30 tablet 5  . terbinafine (LAMISIL) 250 MG tablet Take 250 mg by mouth daily.    . Vitamin D, Ergocalciferol, (DRISDOL) 50000 UNITS CAPS capsule TAKE 1 CAPSULE (50,000 UNITS TOTAL) BY MOUTH EVERY 7 (SEVEN) DAYS. 30 capsule  0    No results found for this or any previous visit (from the past 48 hour(s)). No results found.  ROS  Blood pressure 115/64, pulse 84, temperature 98.6 F (37 C), temperature source Oral, resp. rate 16, height 5' 5"  (1.651 m), weight 159 lb (72.122 kg), last menstrual period 07/22/2015, SpO2 100 %. Physical Exam  Constitutional: She is oriented to person, place, and time. She appears well-developed and well-nourished.  HENT:  Mouth/Throat: Oropharynx is clear and moist.   3 cm subcutaneous lesion superolateral to outer edge of left orbit consistent with lipoma.  Eyes: Conjunctivae are normal. No scleral icterus.  Neck: No  thyromegaly present.  Cardiovascular: Normal rate, regular rhythm and normal heart sounds.   No murmur heard. Respiratory: Effort normal and breath sounds normal.  GI: Soft. She exhibits no distension and no mass. There is no tenderness.  Musculoskeletal: She exhibits no edema.  Lymphadenopathy:    She has no cervical adenopathy.  Neurological: She is alert and oriented to person, place, and time.  Skin: Skin is warm and dry.     Assessment/Plan  Rectal bleeding.  History of Crohn's disease.  Diagnostic/surveillance colonoscopy.  Rogene Houston, MD 08/10/2015, 1:28 PM

## 2015-08-10 NOTE — Op Note (Signed)
COLONOSCOPY PROCEDURE REPORT  PATIENT:  Tamara Martin  MR#:  [id number] Birthdate:  1976/03/02, 40 y.o., female Endoscopist:  Dr. Rogene Houston, MD  Procedure Date: 08/10/2015  Procedure:   Colonoscopy  Indications:  Patient is 40 year old Caucasian female who was 20 year history of ileorectal Crohn's disease who was last colonoscopy was 10 years ago and now presents with rectal bleeding. She has noted improvement with Canasa suppositories. She remains on oral mesalamine.  Informed Consent:  The procedure and risks were reviewed with the patient and informed consent was obtained.  Medications:  Demerol 50 mg IV Versed 9 mg IV  First dose administered at 1335 Last dose administered at  67  Description of procedure:  After a digital rectal exam was performed, that colonoscope was advanced from the anus through the rectum and colon to the area of the cecum, ileocecal valve and appendiceal orifice. The cecum was deeply intubated. These structures were well-seen and photographed for the record. From the level of the cecum and ileocecal valve, the scope was slowly and cautiously withdrawn. The mucosal surfaces were carefully surveyed utilizing scope tip to flexion to facilitate fold flattening as needed. The scope was pulled down into the rectum where a thorough exam including retroflexion was performed. Terminal ileum was also examined.  Findings:   Prep excellent. Normal mucosa of terminal ileum. Normal mucosa of cecum, ascending colon, hepatic flexure, transverse colon, splenic flexure, descending and sigmoid colon. Mucosa of proximal rectum was normal. Erythema and mucosal friability noted to distal rectal mucosa. Small hemorrhoids below the dentate line.   Therapeutic/Diagnostic Maneuvers Performed:   None  Complications: None  EBL: None  Cecal Withdrawal Time:  14 minutes  Impression:  Normal mucosa of terminal ileum. Normal colonoscopy except mild distal  proctitis. Small external hemorrhoids.  Recommendations:  Standard instructions given. Continue Canasa suppository 1 per rectum daily at bedtime for 2 weeks and then after twice a week. Continue Pentasa 1 g by mouth 4 times a day. Office visit in 6 months.   Anayia Eugene U  08/10/2015 2:10 PM  CC: Dr. Glo Herring., MD & Dr. Rayne Du ref. provider found

## 2015-08-14 ENCOUNTER — Encounter (HOSPITAL_COMMUNITY): Payer: Self-pay | Admitting: Internal Medicine

## 2015-10-07 DIAGNOSIS — D2339 Other benign neoplasm of skin of other parts of face: Secondary | ICD-10-CM

## 2015-10-07 HISTORY — DX: Other benign neoplasm of skin of other parts of face: D23.39

## 2015-10-23 ENCOUNTER — Encounter (HOSPITAL_BASED_OUTPATIENT_CLINIC_OR_DEPARTMENT_OTHER): Payer: Self-pay | Admitting: *Deleted

## 2015-10-23 NOTE — H&P (Signed)
  Subjective:    Patient ID: Tamara Martin is a 40 y.o. female.  HPI  Referred by Dr. Elvera Lennox for evaluation left brow mass. Has been present since she was an infant with slow continued growth. Sought consultation for removal several years ago but was scared off as physician counseled there would be risk to temporal neve branch in this area. However has continued to grow in size and not desires removal. Dr. Elvera Lennox counseled likely lipoma. No prior biopsy.  Review of Systems  Neurological: Positive for headaches.  Remainder 12 point review negative.     Objective:   Physical Exam  HENT:  Left lateral brow with nonmobile soft mass no visible punctum 2.5 cm  Cardiovascular: Normal rate, regular rhythm and normal heart sounds.  Pulmonary/Chest: Effort normal and breath sounds normal.  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 Has had continued growth and recommend excision. Reviewed incision adjacent to brow hair, scar maturation over several months, importance sun protection. Agree with her prior consultation that indeed frontal branch injury is possible, if dermoid likely in deeper plane than this nerve, but still risk. Also local anesthesia will at least temporarily produce this brow asymmetry. Reviewed likely will bruise, her Crohns medications also put her at risk of this and to ensure ok to return to work with black eye. Reviewed post procedure visits, limitations, sutures. Reviewed risk anesthesia, bleeding, recurrence.   Irene Limbo, MD [facility] [phone]

## 2015-10-27 ENCOUNTER — Ambulatory Visit (HOSPITAL_BASED_OUTPATIENT_CLINIC_OR_DEPARTMENT_OTHER): Payer: BC Managed Care – PPO | Admitting: Anesthesiology

## 2015-10-27 ENCOUNTER — Ambulatory Visit (HOSPITAL_BASED_OUTPATIENT_CLINIC_OR_DEPARTMENT_OTHER)
Admission: RE | Admit: 2015-10-27 | Discharge: 2015-10-27 | Disposition: A | Discharge status: Home / Self Care | Payer: BC Managed Care – PPO | Source: Ambulatory Visit | Attending: Plastic Surgery | Admitting: Plastic Surgery

## 2015-10-27 ENCOUNTER — Encounter (HOSPITAL_BASED_OUTPATIENT_CLINIC_OR_DEPARTMENT_OTHER): Payer: Self-pay | Admitting: *Deleted

## 2015-10-27 ENCOUNTER — Encounter (HOSPITAL_BASED_OUTPATIENT_CLINIC_OR_DEPARTMENT_OTHER)
Admission: RE | Disposition: A | Discharge status: Home / Self Care | Payer: Self-pay | Source: Ambulatory Visit | Attending: Plastic Surgery

## 2015-10-27 DIAGNOSIS — L728 Other follicular cysts of the skin and subcutaneous tissue: Secondary | ICD-10-CM | POA: Insufficient documentation

## 2015-10-27 HISTORY — DX: Crohn's disease of large intestine without complications: K50.10

## 2015-10-27 HISTORY — DX: Other benign neoplasm of skin of other parts of face: D23.39

## 2015-10-27 HISTORY — DX: Menstrual migraine, not intractable, without status migrainosus: G43.829

## 2015-10-27 HISTORY — PX: MASS EXCISION: SHX2000

## 2015-10-27 SURGERY — EXCISION MASS
Anesthesia: General | Site: Face | Laterality: Left

## 2015-10-27 MED ORDER — MIDAZOLAM HCL 2 MG/2ML IJ SOLN
INTRAMUSCULAR | Status: AC
  Filled 2015-10-27: qty 2

## 2015-10-27 MED ORDER — DEXAMETHASONE SODIUM PHOSPHATE 4 MG/ML IJ SOLN
INTRAMUSCULAR | Status: DC | PRN
  Administered 2015-10-27: 10 mg via INTRAVENOUS

## 2015-10-27 MED ORDER — FENTANYL CITRATE (PF) 100 MCG/2ML IJ SOLN
INTRAMUSCULAR | Status: AC
  Filled 2015-10-27: qty 2

## 2015-10-27 MED ORDER — ONDANSETRON HCL 4 MG/2ML IJ SOLN
INTRAMUSCULAR | Status: AC
  Filled 2015-10-27: qty 2

## 2015-10-27 MED ORDER — LIDOCAINE HCL (CARDIAC) 20 MG/ML IV SOLN
INTRAVENOUS | Status: AC
  Filled 2015-10-27: qty 5

## 2015-10-27 MED ORDER — MIDAZOLAM HCL 2 MG/2ML IJ SOLN
1.0000 mg | INTRAMUSCULAR | Status: DC | PRN
  Administered 2015-10-27: 2 mg via INTRAVENOUS

## 2015-10-27 MED ORDER — BUPIVACAINE-EPINEPHRINE 0.25% -1:200000 IJ SOLN
INTRAMUSCULAR | Status: DC | PRN
  Administered 2015-10-27: 2 mL

## 2015-10-27 MED ORDER — HYDROCODONE-ACETAMINOPHEN 5-325 MG PO TABS: 1.0000 | ORAL_TABLET | ORAL | Status: DC | PRN

## 2015-10-27 MED ORDER — LACTATED RINGERS IV SOLN
INTRAVENOUS | Status: DC
  Administered 2015-10-27 (×2): via INTRAVENOUS

## 2015-10-27 MED ORDER — CLINDAMYCIN PHOSPHATE 600 MG/50ML IV SOLN
INTRAVENOUS | Status: AC
  Filled 2015-10-27: qty 50

## 2015-10-27 MED ORDER — CLINDAMYCIN PHOSPHATE 600 MG/50ML IV SOLN
600.0000 mg | INTRAVENOUS | Status: AC
  Administered 2015-10-27: 600 mg via INTRAVENOUS

## 2015-10-27 MED ORDER — GLYCOPYRROLATE 0.2 MG/ML IJ SOLN: 0.2000 mg | Freq: Once | INTRAMUSCULAR | Status: DC | PRN

## 2015-10-27 MED ORDER — PROMETHAZINE HCL 25 MG/ML IJ SOLN: 6.2500 mg | INTRAMUSCULAR | Status: DC | PRN

## 2015-10-27 MED ORDER — DEXAMETHASONE SODIUM PHOSPHATE 10 MG/ML IJ SOLN
INTRAMUSCULAR | Status: AC
  Filled 2015-10-27: qty 1

## 2015-10-27 MED ORDER — FENTANYL CITRATE (PF) 100 MCG/2ML IJ SOLN
50.0000 ug | INTRAMUSCULAR | Status: AC | PRN
  Administered 2015-10-27: 25 ug via INTRAVENOUS
  Administered 2015-10-27: 100 ug via INTRAVENOUS
  Administered 2015-10-27: 50 ug via INTRAVENOUS
  Administered 2015-10-27: 25 ug via INTRAVENOUS

## 2015-10-27 MED ORDER — FENTANYL CITRATE (PF) 100 MCG/2ML IJ SOLN
25.0000 ug | INTRAMUSCULAR | Status: DC | PRN
  Administered 2015-10-27: 50 ug via INTRAVENOUS

## 2015-10-27 MED ORDER — PROPOFOL 500 MG/50ML IV EMUL
INTRAVENOUS | Status: AC
  Filled 2015-10-27: qty 50

## 2015-10-27 MED ORDER — BUPIVACAINE-EPINEPHRINE (PF) 0.25% -1:200000 IJ SOLN
INTRAMUSCULAR | Status: AC
  Filled 2015-10-27: qty 30

## 2015-10-27 MED ORDER — PROPOFOL 10 MG/ML IV BOLUS
INTRAVENOUS | Status: DC | PRN
  Administered 2015-10-27: 50 mg via INTRAVENOUS
  Administered 2015-10-27: 150 mg via INTRAVENOUS

## 2015-10-27 MED ORDER — SCOPOLAMINE 1 MG/3DAYS TD PT72: 1.0000 | MEDICATED_PATCH | Freq: Once | TRANSDERMAL | Status: DC | PRN

## 2015-10-27 MED ORDER — ONDANSETRON HCL 4 MG/2ML IJ SOLN
INTRAMUSCULAR | Status: DC | PRN
  Administered 2015-10-27: 4 mg via INTRAVENOUS

## 2015-10-27 MED ORDER — LIDOCAINE HCL (CARDIAC) 20 MG/ML IV SOLN
INTRAVENOUS | Status: DC | PRN
  Administered 2015-10-27: 80 mg via INTRAVENOUS

## 2015-10-27 MED ORDER — MEPERIDINE HCL 25 MG/ML IJ SOLN: 6.2500 mg | INTRAMUSCULAR | Status: DC | PRN

## 2015-10-27 MED ORDER — LIDOCAINE-EPINEPHRINE 1 %-1:100000 IJ SOLN
INTRAMUSCULAR | Status: AC
  Filled 2015-10-27: qty 1

## 2015-10-27 SURGICAL SUPPLY — 66 items
APL SKNCLS STERI-STRIP NONHPOA (GAUZE/BANDAGES/DRESSINGS)
BENZOIN TINCTURE PRP APPL 2/3 (GAUZE/BANDAGES/DRESSINGS) IMPLANT
BLADE CLIPPER SURG (BLADE) IMPLANT
BLADE SURG 11 STRL SS (BLADE) IMPLANT
BLADE SURG 15 STRL LF DISP TIS (BLADE) ×1 IMPLANT
BLADE SURG 15 STRL SS (BLADE) ×3
CANISTER SUCT 1200ML W/VALVE (MISCELLANEOUS) ×2 IMPLANT
CHLORAPREP W/TINT 26ML (MISCELLANEOUS) ×1 IMPLANT
CLOSURE WOUND 1/2 X4 (GAUZE/BANDAGES/DRESSINGS)
COVER BACK TABLE 60X90IN (DRAPES) ×3 IMPLANT
COVER MAYO STAND STRL (DRAPES) ×3 IMPLANT
DRAIN JP 10F RND SILICONE (MISCELLANEOUS) IMPLANT
DRAPE LAPAROTOMY 100X72 PEDS (DRAPES) IMPLANT
DRAPE U-SHAPE 76X120 STRL (DRAPES) ×2 IMPLANT
DRSG TELFA 3X8 NADH (GAUZE/BANDAGES/DRESSINGS) IMPLANT
ELECT COATED BLADE 2.86 ST (ELECTRODE) IMPLANT
ELECT NDL BLADE 2-5/6 (NEEDLE) ×1 IMPLANT
ELECT NEEDLE BLADE 2-5/6 (NEEDLE) ×3 IMPLANT
ELECT REM PT RETURN 9FT ADLT (ELECTROSURGICAL) ×3
ELECT REM PT RETURN 9FT PED (ELECTROSURGICAL)
ELECTRODE REM PT RETRN 9FT PED (ELECTROSURGICAL) IMPLANT
ELECTRODE REM PT RTRN 9FT ADLT (ELECTROSURGICAL) IMPLANT
EVACUATOR SILICONE 100CC (DRAIN) IMPLANT
GAUZE XEROFORM 1X8 LF (GAUZE/BANDAGES/DRESSINGS) IMPLANT
GLOVE BIO SURGEON STRL SZ 6 (GLOVE) ×3 IMPLANT
GLOVE BIOGEL PI IND STRL 7.0 (GLOVE) IMPLANT
GLOVE BIOGEL PI INDICATOR 7.0 (GLOVE) ×2
GLOVE ECLIPSE 6.5 STRL STRAW (GLOVE) ×2 IMPLANT
GOWN STRL REUS W/ TWL LRG LVL3 (GOWN DISPOSABLE) ×2 IMPLANT
GOWN STRL REUS W/TWL LRG LVL3 (GOWN DISPOSABLE) ×6
LIQUID BAND (GAUZE/BANDAGES/DRESSINGS) IMPLANT
NDL HYPO 30GX1 BEV (NEEDLE) IMPLANT
NDL PRECISIONGLIDE 27X1.5 (NEEDLE) ×1 IMPLANT
NEEDLE HYPO 30GX1 BEV (NEEDLE) IMPLANT
NEEDLE PRECISIONGLIDE 27X1.5 (NEEDLE) ×3 IMPLANT
NS IRRIG 1000ML POUR BTL (IV SOLUTION) IMPLANT
PACK BASIN DAY SURGERY FS (CUSTOM PROCEDURE TRAY) ×3 IMPLANT
PAD DRESSING TELFA 3X8 NADH (GAUZE/BANDAGES/DRESSINGS) IMPLANT
PENCIL BUTTON HOLSTER BLD 10FT (ELECTRODE) ×3 IMPLANT
RUBBERBAND STERILE (MISCELLANEOUS) IMPLANT
SHEET MEDIUM DRAPE 40X70 STRL (DRAPES) IMPLANT
SLEEVE SCD COMPRESS KNEE MED (MISCELLANEOUS) IMPLANT
SPONGE GAUZE 2X2 8PLY STER LF (GAUZE/BANDAGES/DRESSINGS)
SPONGE GAUZE 2X2 8PLY STRL LF (GAUZE/BANDAGES/DRESSINGS) IMPLANT
SPONGE GAUZE 4X4 12PLY STER LF (GAUZE/BANDAGES/DRESSINGS) IMPLANT
SPONGE LAP 18X18 X RAY DECT (DISPOSABLE) ×2 IMPLANT
STRIP CLOSURE SKIN 1/2X4 (GAUZE/BANDAGES/DRESSINGS) IMPLANT
SUCTION FRAZIER HANDLE 10FR (MISCELLANEOUS) ×2
SUCTION TUBE FRAZIER 10FR DISP (MISCELLANEOUS) IMPLANT
SUT ETHILON 4 0 PS 2 18 (SUTURE) IMPLANT
SUT MNCRL AB 4-0 PS2 18 (SUTURE) IMPLANT
SUT MON AB 5-0 P3 18 (SUTURE) ×2 IMPLANT
SUT PLAIN 5 0 P 3 18 (SUTURE) IMPLANT
SUT PROLENE 5 0 P 3 (SUTURE) IMPLANT
SUT PROLENE 6 0 P 1 18 (SUTURE) IMPLANT
SUT PROLENE 6 0 PC 1 (SUTURE) ×2 IMPLANT
SUT VICRYL 4-0 PS2 18IN ABS (SUTURE) IMPLANT
SWAB COLLECTION DEVICE MRSA (MISCELLANEOUS) IMPLANT
SWAB CULTURE ESWAB REG 1ML (MISCELLANEOUS) IMPLANT
SYR BULB 3OZ (MISCELLANEOUS) IMPLANT
SYR CONTROL 10ML LL (SYRINGE) ×3 IMPLANT
TOWEL OR 17X24 6PK STRL BLUE (TOWEL DISPOSABLE) ×3 IMPLANT
TRAY DSU PREP LF (CUSTOM PROCEDURE TRAY) ×2 IMPLANT
TUBE CONNECTING 20'X1/4 (TUBING) ×1
TUBE CONNECTING 20X1/4 (TUBING) ×1 IMPLANT
YANKAUER SUCT BULB TIP 10FT TU (MISCELLANEOUS) IMPLANT

## 2015-10-27 NOTE — Brief Op Note (Signed)
10/27/2015  10:46 AM  PATIENT:  Tamara Martin  40 y.o. female  PRE-OPERATIVE DIAGNOSIS:  DERMOID CYST LEFT BROW  POST-OPERATIVE DIAGNOSIS:  DERMOID CYST LEFT BROW  PROCEDURE:  Procedure(s): EXCISION MASS LEFT BROW ( EXCISION OF SUBMUSCULAR MASS LEFT BROW 2.5CM) (Left)  SURGEON:  Surgeon(s) and Role:    * Irene Limbo, MD - Primary  PHYSICIAN ASSISTANT:   ASSISTANTS: none   ANESTHESIA:   general  EBL:  Total I/O In: 1300 [I.V.:1300] Out: 5 [Blood:5]  BLOOD ADMINISTERED:none  DRAINS: none   LOCAL MEDICATIONS USED:  MARCAINE     SPECIMEN:  Source of Specimen:  left dermoid cyst brow  DISPOSITION OF SPECIMEN:  PATHOLOGY  COUNTS:  YES  TOURNIQUET:  * No tourniquets in log *  DICTATION: .Note written in EPIC  PLAN OF CARE: Discharge to home after PACU  PATIENT DISPOSITION:  PACU - hemodynamically stable.   Delay start of Pharmacological VTE agent (>24hrs) due to surgical blood loss or risk of bleeding: not applicable

## 2015-10-27 NOTE — Anesthesia Procedure Notes (Signed)
Procedure Name: LMA Insertion Date/Time: 10/27/2015 10:10 AM Performed by: Maryella Shivers Pre-anesthesia Checklist: Patient identified, Emergency Drugs available, Suction available and Patient being monitored Patient Re-evaluated:Patient Re-evaluated prior to inductionOxygen Delivery Method: Circle System Utilized Preoxygenation: Pre-oxygenation with 100% oxygen Intubation Type: IV induction Ventilation: Mask ventilation without difficulty LMA: LMA flexible inserted LMA Size: 4.0 Number of attempts: 1 Airway Equipment and Method: Bite block Placement Confirmation: positive ETCO2 Tube secured with: Tape Dental Injury: Teeth and Oropharynx as per pre-operative assessment

## 2015-10-27 NOTE — Interval H&P Note (Signed)
History and Physical Interval Note:  10/27/2015 6:56 AM  Tamara Martin  has presented today for surgery, with the diagnosis of DERMOID CYST LEFT BROW  The various methods of treatment have been discussed with the patient and family. After consideration of risks, benefits and other options for treatment, the patient has consented to  Procedure(s): EXCISION MASS LEFT BROW ( EXCISION OF SUBMUSCULAR MASS LEFT BROW 2.5CM) (Left) as a surgical intervention .  The patient's history has been reviewed, patient examined, no change in status, stable for surgery.  I have reviewed the patient's chart and labs.  Questions were answered to the patient's satisfaction.     [name]

## 2015-10-27 NOTE — Anesthesia Preprocedure Evaluation (Addendum)
Anesthesia Evaluation  Patient identified by MRN, date of birth, ID band Patient awake    Reviewed: Allergy & Precautions, NPO status , Patient's Chart, lab work & pertinent test results  Airway Mallampati: II   Neck ROM: Full    Dental  (+) Teeth Intact, Dental Advisory Given   Pulmonary neg pulmonary ROS,    breath sounds clear to auscultation       Cardiovascular negative cardio ROS   Rhythm:Regular     Neuro/Psych Anxiety negative neurological ROS  negative psych ROS   GI/Hepatic Neg liver ROS, [place]   Endo/Other  negative endocrine ROS  Renal/GU negative Renal ROS  negative genitourinary   Musculoskeletal negative musculoskeletal ROS (+)   Abdominal   Peds negative pediatric ROS (+)  Hematology negative hematology ROS (+)   Anesthesia Other Findings   Reproductive/Obstetrics negative OB ROS                            Anesthesia Physical Anesthesia Plan  ASA: II  Anesthesia Plan: General   Post-op Pain Management:    Induction: Intravenous  Airway Management Planned: LMA  Additional Equipment:   Intra-op Plan:   Post-operative Plan: Extubation in OR  Informed Consent: I have reviewed the patients History and Physical, chart, labs and discussed the procedure including the risks, benefits and alternatives for the proposed anesthesia with the patient or authorized representative who has indicated his/her understanding and acceptance.     Plan Discussed with:   Anesthesia Plan Comments:        Anesthesia Quick Evaluation

## 2015-10-27 NOTE — Op Note (Signed)
Operative Note   DATE OF OPERATION: 4.21.17  LOCATION: [facility]- outpatient  SURGICAL DIVISION: Plastic Surgery  PREOPERATIVE DIAGNOSES:  1. Dermoid cyst left brow  POSTOPERATIVE DIAGNOSES:  same  PROCEDURE:  1. Excision submuscular mass brow 2.5 cm  SURGEON: Irene Limbo MD MBA  ASSISTANT: none  ANESTHESIA:  General.   EBL: minimal  COMPLICATIONS: None immediate.   INDICATIONS FOR PROCEDURE:  The patient, Tamara Martin, is a 40 y.o. female born on 17-Oct-1975, is here for excision of mass left brow present since patient was infant.   FINDINGS: Mass located adherent to bone at junction frontal bone and lateral orbital rim, contents seroma like with calcified material.  DESCRIPTION OF PROCEDURE:  The patient's operative site was marked with the patient in the preoperative area. The patient was taken to the operating room. SCDs were placed and IV antibiotics were given. The patient's operative site was prepped and draped in a sterile fashion. A time out was performed and all information was confirmed to be correct.  Local anesthetic infiltrated to perform left supraorbital nerve block and surrounding mass. Incision made at superior left brow. Incision carried through subcutaneous tissue and orbicularis oculi muscle divided in direction of fibers to expose mass. Scissor dissection completed to elevate muscle off underlying cyst wall. Cyst then freed from periosteum with scissor dissection. Cyst entered during dissection and contents as noted in findings. Wound irrigated and hemostasis obtained. Closure completed with 5-0 monocryl in dermis and running 6-0 prolene for skin closure.   The patient was allowed to wake from anesthesia, extubated and taken to the recovery room in satisfactory condition.   SPECIMENS: dermoid cyst left brow  DRAINS: none  Irene Limbo, MD [facility] [phone]

## 2015-10-27 NOTE — Discharge Instructions (Signed)

## 2015-10-27 NOTE — Anesthesia Postprocedure Evaluation (Signed)
Anesthesia Post Note  Patient: Tamara Martin  Procedure(s) Performed: Procedure(s) (LRB): EXCISION MASS LEFT BROW ( EXCISION OF SUBMUSCULAR MASS LEFT BROW 2.5CM) (Left)  Patient location during evaluation: PACU Anesthesia Type: General Level of consciousness: awake and alert Pain management: pain level controlled Vital Signs Assessment: post-procedure vital signs reviewed and stable Respiratory status: spontaneous breathing, nonlabored ventilation, respiratory function stable and patient connected to nasal cannula oxygen Cardiovascular status: blood pressure returned to baseline and stable Postop Assessment: no signs of nausea or vomiting Anesthetic complications: no    Last Vitals:  Filed Vitals:   10/27/15 1115 10/27/15 1130  BP: 112/74 113/72  Pulse: 75 96  Temp:    Resp: 15 14    Last Pain:  Filed Vitals:   10/27/15 1133  PainSc: [name]

## 2015-10-27 NOTE — Transfer of Care (Signed)
Immediate Anesthesia Transfer of Care Note  Patient: Tamara Martin  Procedure(s) Performed: Procedure(s): EXCISION MASS LEFT BROW ( EXCISION OF SUBMUSCULAR MASS LEFT BROW 2.5CM) (Left)  Patient Location: PACU  Anesthesia Type:General  Level of Consciousness: sedated  Airway & Oxygen Therapy: Patient Spontanous Breathing and Patient connected to face mask oxygen  Post-op Assessment: Report given to RN and Post -op Vital signs reviewed and stable  Post vital signs: Reviewed and stable  Last Vitals:  Filed Vitals:   10/27/15 1052 10/27/15 1053  BP:  91/53  Pulse: 62 62  Temp:    Resp:  11    Complications: No apparent anesthesia complications

## 2015-10-30 ENCOUNTER — Encounter (HOSPITAL_BASED_OUTPATIENT_CLINIC_OR_DEPARTMENT_OTHER): Payer: Self-pay | Admitting: Plastic Surgery

## 2015-10-30 ENCOUNTER — Other Ambulatory Visit (INDEPENDENT_AMBULATORY_CARE_PROVIDER_SITE_OTHER): Payer: Self-pay | Admitting: Internal Medicine

## 2016-01-01 ENCOUNTER — Other Ambulatory Visit (INDEPENDENT_AMBULATORY_CARE_PROVIDER_SITE_OTHER): Payer: Self-pay | Admitting: Internal Medicine

## 2016-01-10 ENCOUNTER — Ambulatory Visit (INDEPENDENT_AMBULATORY_CARE_PROVIDER_SITE_OTHER): Payer: BC Managed Care – PPO | Admitting: Internal Medicine

## 2016-01-17 ENCOUNTER — Ambulatory Visit (INDEPENDENT_AMBULATORY_CARE_PROVIDER_SITE_OTHER): Payer: BC Managed Care – PPO | Admitting: Internal Medicine

## 2016-01-17 ENCOUNTER — Other Ambulatory Visit (INDEPENDENT_AMBULATORY_CARE_PROVIDER_SITE_OTHER): Payer: Self-pay | Admitting: Internal Medicine

## 2016-02-20 ENCOUNTER — Other Ambulatory Visit: Payer: BC Managed Care – PPO | Admitting: Adult Health

## 2016-03-06 ENCOUNTER — Other Ambulatory Visit: Payer: Self-pay | Admitting: Adult Health

## 2016-03-12 ENCOUNTER — Other Ambulatory Visit: Payer: Self-pay | Admitting: *Deleted

## 2016-03-12 MED ORDER — SERTRALINE HCL 25 MG PO TABS
25.0000 mg | ORAL_TABLET | Freq: Every day | ORAL | 2 refills | Status: DC
Start: 2016-03-12 — End: 2016-03-12

## 2016-03-13 ENCOUNTER — Encounter (INDEPENDENT_AMBULATORY_CARE_PROVIDER_SITE_OTHER): Payer: Self-pay | Admitting: Internal Medicine

## 2016-03-13 ENCOUNTER — Ambulatory Visit (INDEPENDENT_AMBULATORY_CARE_PROVIDER_SITE_OTHER): Payer: BC Managed Care – PPO | Admitting: Internal Medicine

## 2016-03-13 VITALS — BP 122/86 | Temp 98.1°F | Ht 66.0 in | Wt 164.5 lb

## 2016-03-13 DIAGNOSIS — K5000 Crohn's disease of small intestine without complications: Secondary | ICD-10-CM

## 2016-03-13 LAB — CBC WITH DIFFERENTIAL/PLATELET
BASOS ABS: 0 {cells}/uL (ref 0–200)
Basophils Relative: 0 %
EOS PCT: 1 %
Eosinophils Absolute: 90 cells/uL (ref 15–500)
HCT: 37.7 % (ref 35.0–45.0)
Hemoglobin: 12.3 g/dL (ref 11.7–15.5)
Lymphocytes Relative: 23 %
Lymphs Abs: 2070 cells/uL (ref 850–3900)
MCH: 28.2 pg (ref 27.0–33.0)
MCHC: 32.6 g/dL (ref 32.0–36.0)
MCV: 86.5 fL (ref 80.0–100.0)
MONOS PCT: 7 %
MPV: 9.7 fL (ref 7.5–12.5)
Monocytes Absolute: 630 cells/uL (ref 200–950)
NEUTROS PCT: 69 %
Neutro Abs: 6210 cells/uL (ref 1500–7800)
PLATELETS: 285 10*3/uL (ref 140–400)
RBC: 4.36 MIL/uL (ref 3.80–5.10)
RDW: 13.6 % (ref 11.0–15.0)
WBC: 9 10*3/uL (ref 3.8–10.8)

## 2016-03-13 MED ORDER — MESALAMINE 1000 MG RE SUPP: RECTAL | 3 refills | Status: DC

## 2016-03-13 NOTE — Patient Instructions (Signed)
Canasa supp twice a week. Continue the Pentasa. OV in 6 months.

## 2016-03-13 NOTE — Progress Notes (Addendum)
   Subjective:    Patient ID: Tamara Martin, female    DOB: 03-13-1976, 40 y.o.   MRN: [id number]  HPI Here today for f/u. 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 Last visit In January she had seen some blood in her stools.  No diarrhea. No urgency. Had passed some mucous. She tells me she says she has seen blood. She saw blood about a month ago.  She has been on the supp x 30 days. She has been taking the suppository every day.  Has not recently seen any blood. No rectal bleeding.  No abdominal pain.  08/10/2015 She underwent a colonoscopy which revealed: Prep excellent. Normal mucosa of terminal ileum. Normal mucosa of cecum, ascending colon, hepatic flexure, transverse colon, splenic flexure, descending and sigmoid colon. Mucosa of proximal rectum was normal. Erythema and mucosal friability noted to distal rectal mucosa. Small hemorrhoids below the dentate line. Continue Canasa suppository 1 per rectum daily at bedtime for 2 weeks and then after twice a week. Continue Pentasa 1 g by mouth 4 times a day.      Review of Systems Past Medical History:  Diagnosis Date  . Anxiety 03/04/2013  . Crohn's disease of colon ([place])   . Dermoid cyst of eyebrow 10/2015   left  . Headache, menstrual migraine     Past Surgical History:  Procedure Laterality Date  . COLONOSCOPY  06/19/2005  . COLONOSCOPY N/A 08/10/2015   Procedure: COLONOSCOPY;  Surgeon: Rogene Houston, MD;  [facility];  Service: Endoscopy;  Laterality: N/A;  1:25  . EYE SURGERY     benign tumor  . MASS EXCISION Left 10/27/2015   Procedure: EXCISION MASS LEFT BROW ( EXCISION OF SUBMUSCULAR MASS LEFT BROW 2.5CM);  Surgeon: Irene Limbo, MD;  Location: [facility];  Service: Plastics;  Laterality: Left;    Allergies  Allergen Reactions  . Penicillins Hives         Current Outpatient Prescriptions on File Prior to Visit  Medication Sig Dispense Refill  . b complex vitamins capsule Take 1  capsule by mouth daily.    . Calcium-Phosphorus-Vitamin D (CALCIUM GUMMIES PO) Take by mouth 2 (two) times daily.    Marland Kitchen FIBER SELECT GUMMIES PO Take 1 tablet by mouth daily.    . Multiple Vitamins-Minerals (MULTI ADULT GUMMIES PO) Take by mouth daily.    Marland Kitchen PENTASA 500 MG CR capsule TAKE 2 CAPSULES BY MOUTH 4 TIMES A DAY 240 capsule 5  . sertraline (ZOLOFT) 25 MG tablet TAKE 1 TABLET BY MOUTH EVERY DAY 30 tablet 3  . Vitamin D, Ergocalciferol, (DRISDOL) 50000 units CAPS capsule Take 50,000 Units by mouth every 14 (fourteen) days.     No current facility-administered medications on file prior to visit.        Objective:   Physical Exam Blood pressure 122/86, temperature 98.1 F (36.7 C), height 5' 6"  (1.676 m), weight 164 lb 8 oz (74.6 kg). Alert and oriented. Skin warm and dry. Oral mucosa is moist.   . Sclera anicteric, conjunctivae is pink. Thyroid not enlarged. No cervical lymphadenopathy. Lungs clear. Heart regular rate and rhythm.  Abdomen is soft. Bowel sounds are positive. No hepatomegaly. No abdominal masses felt. No tenderness.  No edema to lower extremities.         Assessment & Plan:  Crohn's flare which is better now. She will take the Canasa supp one twice a week and continue the Pentasa.  OV in 6 months.

## 2016-03-14 ENCOUNTER — Other Ambulatory Visit (HOSPITAL_COMMUNITY)
Admission: RE | Admit: 2016-03-14 | Discharge: 2016-03-14 | Disposition: A | Discharge status: Home / Self Care | Payer: BC Managed Care – PPO | Source: Ambulatory Visit | Attending: Adult Health | Admitting: Adult Health

## 2016-03-14 ENCOUNTER — Ambulatory Visit (INDEPENDENT_AMBULATORY_CARE_PROVIDER_SITE_OTHER): Payer: BC Managed Care – PPO | Admitting: Adult Health

## 2016-03-14 ENCOUNTER — Encounter: Payer: Self-pay | Admitting: Adult Health

## 2016-03-14 VITALS — BP 132/70 | HR 72 | Ht 65.0 in | Wt 163.5 lb

## 2016-03-14 DIAGNOSIS — Z1212 Encounter for screening for malignant neoplasm of rectum: Secondary | ICD-10-CM | POA: Diagnosis not present

## 2016-03-14 DIAGNOSIS — Z1151 Encounter for screening for human papillomavirus (HPV): Secondary | ICD-10-CM | POA: Diagnosis present

## 2016-03-14 DIAGNOSIS — N816 Rectocele: Secondary | ICD-10-CM | POA: Insufficient documentation

## 2016-03-14 DIAGNOSIS — Z01419 Encounter for gynecological examination (general) (routine) without abnormal findings: Secondary | ICD-10-CM

## 2016-03-14 LAB — HEMOCCULT GUIAC POC 1CARD (OFFICE): FECAL OCCULT BLD: NEGATIVE

## 2016-03-14 LAB — SEDIMENTATION RATE: SED RATE: 10 mm/h (ref 0–20)

## 2016-03-14 NOTE — Patient Instructions (Signed)
Get mammogram [phone] Physical in 1 year, pap in 3 if normal Labs in near future fasting  Wean off zoloft

## 2016-03-14 NOTE — Progress Notes (Signed)
Patient ID: Tamara Martin, female   DOB: 1976/06/30, 40 y.o.   MRN: [id number] History of Present Illness: Tamara Martin is a 40 year old white female, married in for a well woman gyn exam and pap. PCP is Dr Gerarda Fraction.   Current Medications, Allergies, Past Medical History, Past Surgical History, Family History and Social History were reviewed in [facility] record.     Review of Systems: Patient denies any headaches, hearing loss, fatigue, blurred vision, shortness of breath, chest pain, abdominal pain, problems with bowel movements, urination, or intercourse. No joint pain or mood swings. She is weaning off zoloft, is taking 1/2 tab already then take 1/2 of that for 2 weeks and stop.   Physical Exam:BP 132/70 (BP Location: Left Arm, Patient Position: Sitting, Cuff Size: Normal)   Pulse 72   Ht 5' 5"  (1.651 m)   Wt 163 lb 8 oz (74.2 kg)   LMP 02/20/2016 (Approximate)   BMI 27.21 kg/m  General:  Well developed, well nourished, no acute distress Skin:  Warm and dry Neck:  Midline trachea, normal thyroid, good ROM, no lymphadenopathy Lungs; Clear to auscultation bilaterally Breast:  No dominant palpable mass, retraction, or nipple discharge Cardiovascular: Regular rate and rhythm Abdomen:  Soft, non tender, no hepatosplenomegaly Pelvic:  External genitalia is normal in appearance, no lesions.  The vagina is normal in appearance. Urethra has no lesions or masses. The cervix is bulbous.Pap with HPV performed.  Uterus is felt to be normal size, shape, and contour.  No adnexal masses or tenderness noted.Bladder is non tender, no masses felt. Rectal: Good sphincter tone, no polyps, or hemorrhoids felt.  Hemoccult negative.+rectocele Extremities/musculoskeletal:  No swelling or varicosities noted, no clubbing or cyanosis Psych:  No mood changes, alert and cooperative,seems happy   Impression: 1. Encounter for gynecological examination with Papanicolaou smear of cervix   2.  Rectocele       Plan: Get mammogram now and yearly Physical in 1 year, pap in 3 if normal Labs in near future fasting Continue to wean off zoloft

## 2016-03-18 LAB — CYTOLOGY - PAP

## 2016-03-27 ENCOUNTER — Other Ambulatory Visit (INDEPENDENT_AMBULATORY_CARE_PROVIDER_SITE_OTHER): Payer: Self-pay | Admitting: Internal Medicine

## 2016-04-08 ENCOUNTER — Telehealth (INDEPENDENT_AMBULATORY_CARE_PROVIDER_SITE_OTHER): Payer: Self-pay | Admitting: Internal Medicine

## 2016-04-08 DIAGNOSIS — K5000 Crohn's disease of small intestine without complications: Secondary | ICD-10-CM

## 2016-04-08 MED ORDER — MESALAMINE 1000 MG RE SUPP: RECTAL | 3 refills | Status: DC

## 2016-04-08 NOTE — Telephone Encounter (Signed)
error 

## 2016-04-08 NOTE — Telephone Encounter (Signed)
Rx

## 2016-09-11 ENCOUNTER — Encounter (INDEPENDENT_AMBULATORY_CARE_PROVIDER_SITE_OTHER): Payer: Self-pay | Admitting: Internal Medicine

## 2016-09-11 ENCOUNTER — Ambulatory Visit (INDEPENDENT_AMBULATORY_CARE_PROVIDER_SITE_OTHER): Payer: BC Managed Care – PPO | Admitting: Internal Medicine

## 2016-09-11 DIAGNOSIS — K5000 Crohn's disease of small intestine without complications: Secondary | ICD-10-CM

## 2016-09-11 MED ORDER — MESALAMINE 1000 MG RE SUPP: RECTAL | 6 refills | Status: DC

## 2016-09-11 NOTE — Patient Instructions (Signed)
OV in 6 months

## 2016-09-11 NOTE — Progress Notes (Signed)
Subjective:    Patient ID: Tamara Martin, female    DOB: 09/10/1975, 41 y.o.   MRN: [id number] In Sept wt 164.8 HPI Here today for f/u. She was last seen in September of 2017.  Hx of ileorectal Crohn.  She is taking the Canasa supp every other night. She has not seen blood. She does see mucous on occasion.  Hx of Crohn's diagnosed at age 13.  Her appetite is good. She has been trying lose weight . She has a BM once a day. She denies having any abdominal. Continues to work full time.    08/10/2015 She underwent a colonoscopy which revealed: Prep excellent. Normal mucosa of terminal ileum. Normal mucosa of cecum, ascending colon, hepatic flexure, transverse colon, splenic flexure, descending and sigmoid colon. Mucosa of proximal rectum was normal. Erythema and mucosal friability noted to distal rectal mucosa. Small hemorrhoids below the dentate line. Continue Canasa suppository 1 per rectum daily at bedtime for 2 weeks and then after twice a week. Continue Pentasa 1 g by mouth 4 times a day.  CBC    Component Value Date/Time   WBC 9.0 03/13/2016 1619   RBC 4.36 03/13/2016 1619   HGB 12.3 03/13/2016 1619   HCT 37.7 03/13/2016 1619   PLT 285 03/13/2016 1619   MCV 86.5 03/13/2016 1619   MCH 28.2 03/13/2016 1619   MCHC 32.6 03/13/2016 1619   RDW 13.6 03/13/2016 1619   LYMPHSABS 2,070 03/13/2016 1619   MONOABS 630 03/13/2016 1619   EOSABS 90 03/13/2016 1619   BASOSABS 0 03/13/2016 1619   Erythrocyte Sedimentation Rate     Component Value Date/Time   ESRSEDRATE 10 03/13/2016 1619     Review of Systems     Past Medical History:  Diagnosis Date  . Anxiety 03/04/2013  . Crohn's disease of colon ([place])   . Dermoid cyst of eyebrow 10/2015   left  . Headache, menstrual migraine     Past Surgical History:  Procedure Laterality Date  . COLONOSCOPY  06/19/2005  . COLONOSCOPY N/A 08/10/2015   Procedure: COLONOSCOPY;  Surgeon: Rogene Houston, MD;  [facility];   Service: Endoscopy;  Laterality: N/A;  1:25  . EYE SURGERY     benign tumor  . MASS EXCISION Left 10/27/2015   Procedure: EXCISION MASS LEFT BROW ( EXCISION OF SUBMUSCULAR MASS LEFT BROW 2.5CM);  Surgeon: Irene Limbo, MD;  Location: [facility];  Service: Plastics;  Laterality: Left;    Allergies  Allergen Reactions  . Penicillins Hives         Current Outpatient Prescriptions on File Prior to Visit  Medication Sig Dispense Refill  . b complex vitamins capsule Take 1 capsule by mouth daily.    . Calcium-Phosphorus-Vitamin D (CALCIUM GUMMIES PO) Take by mouth 2 (two) times daily.    Marland Kitchen FIBER SELECT GUMMIES PO Take 1 tablet by mouth daily.    . Multiple Vitamins-Minerals (MULTI ADULT GUMMIES PO) Take by mouth daily.    Marland Kitchen PENTASA 500 MG CR capsule TAKE 2 CAPSULES BY MOUTH 4 TIMES A DAY 240 capsule 11   No current facility-administered medications on file prior to visit.     Objective:   Physical Exam Blood pressure 122/84, pulse 72, temperature 98.5 F (36.9 C), height 5' 5"  (1.651 m), weight 161 lb 4.8 oz (73.2 kg). Alert and oriented. Skin warm and dry. Oral mucosa is moist.   . Sclera anicteric, conjunctivae is pink. Thyroid not enlarged. No cervical  lymphadenopathy. Lungs clear. Heart regular rate and rhythm.  Abdomen is soft. Bowel sounds are positive. No hepatomegaly. No abdominal masses felt. No tenderness.  No edema to lower extremities.         Assessment & Plan:  Crohn's. She seems to be doing well.  Continue the Pentasa and Mesalamine supp. OV in 6 months. Blood work next [facility] visit.

## 2017-01-02 ENCOUNTER — Other Ambulatory Visit: Payer: Self-pay | Admitting: Adult Health

## 2017-01-02 DIAGNOSIS — Z1231 Encounter for screening mammogram for malignant neoplasm of breast: Secondary | ICD-10-CM

## 2017-02-03 ENCOUNTER — Ambulatory Visit (HOSPITAL_COMMUNITY)
Admission: RE | Admit: 2017-02-03 | Discharge: 2017-02-03 | Disposition: A | Discharge status: Home / Self Care | Payer: BC Managed Care – PPO | Source: Ambulatory Visit | Attending: Adult Health | Admitting: Adult Health

## 2017-02-03 ENCOUNTER — Other Ambulatory Visit: Payer: Self-pay | Admitting: Adult Health

## 2017-02-03 DIAGNOSIS — Z1231 Encounter for screening mammogram for malignant neoplasm of breast: Secondary | ICD-10-CM | POA: Insufficient documentation

## 2017-02-03 DIAGNOSIS — N6310 Unspecified lump in the right breast, unspecified quadrant: Secondary | ICD-10-CM

## 2017-02-03 DIAGNOSIS — R928 Other abnormal and inconclusive findings on diagnostic imaging of breast: Secondary | ICD-10-CM

## 2017-02-11 ENCOUNTER — Encounter (HOSPITAL_COMMUNITY): Payer: BC Managed Care – PPO

## 2017-02-18 ENCOUNTER — Other Ambulatory Visit: Payer: Self-pay | Admitting: Adult Health

## 2017-02-18 ENCOUNTER — Ambulatory Visit (HOSPITAL_COMMUNITY)
Admission: RE | Admit: 2017-02-18 | Discharge: 2017-02-18 | Disposition: A | Discharge status: Home / Self Care | Payer: BC Managed Care – PPO | Source: Ambulatory Visit | Attending: Adult Health | Admitting: Adult Health

## 2017-02-18 DIAGNOSIS — N6310 Unspecified lump in the right breast, unspecified quadrant: Secondary | ICD-10-CM | POA: Insufficient documentation

## 2017-02-18 DIAGNOSIS — R928 Other abnormal and inconclusive findings on diagnostic imaging of breast: Secondary | ICD-10-CM

## 2017-02-25 ENCOUNTER — Ambulatory Visit (HOSPITAL_COMMUNITY)
Admission: RE | Admit: 2017-02-25 | Discharge: 2017-02-25 | Disposition: A | Discharge status: Home / Self Care | Payer: BC Managed Care – PPO | Source: Ambulatory Visit | Attending: Adult Health | Admitting: Adult Health

## 2017-02-25 ENCOUNTER — Other Ambulatory Visit (HOSPITAL_COMMUNITY): Payer: Self-pay | Admitting: Internal Medicine

## 2017-02-25 ENCOUNTER — Ambulatory Visit (HOSPITAL_COMMUNITY)
Admission: RE | Admit: 2017-02-25 | Discharge: 2017-02-25 | Disposition: A | Discharge status: Home / Self Care | Payer: BC Managed Care – PPO | Source: Ambulatory Visit | Attending: Internal Medicine | Admitting: Internal Medicine

## 2017-02-25 ENCOUNTER — Other Ambulatory Visit (HOSPITAL_COMMUNITY): Payer: Self-pay | Admitting: Diagnostic Radiology

## 2017-02-25 DIAGNOSIS — D241 Benign neoplasm of right breast: Secondary | ICD-10-CM | POA: Diagnosis not present

## 2017-02-25 DIAGNOSIS — R928 Other abnormal and inconclusive findings on diagnostic imaging of breast: Secondary | ICD-10-CM

## 2017-02-25 DIAGNOSIS — N6312 Unspecified lump in the right breast, upper inner quadrant: Secondary | ICD-10-CM | POA: Diagnosis not present

## 2017-02-25 DIAGNOSIS — N6310 Unspecified lump in the right breast, unspecified quadrant: Secondary | ICD-10-CM

## 2017-02-25 MED ORDER — LIDOCAINE HCL (PF) 1 % IJ SOLN
INTRAMUSCULAR | Status: AC
  Administered 2017-02-25: 5 mL
  Filled 2017-02-25: qty 5

## 2017-02-25 MED ORDER — LIDOCAINE-EPINEPHRINE (PF) 1 %-1:200000 IJ SOLN
INTRAMUSCULAR | Status: AC
  Administered 2017-02-25: 7 mL
  Filled 2017-02-25: qty 30

## 2017-02-27 ENCOUNTER — Encounter: Payer: Self-pay | Admitting: Adult Health

## 2017-02-27 DIAGNOSIS — D241 Benign neoplasm of right breast: Secondary | ICD-10-CM

## 2017-02-27 HISTORY — DX: Benign neoplasm of right breast: D24.1

## 2017-03-17 ENCOUNTER — Ambulatory Visit (INDEPENDENT_AMBULATORY_CARE_PROVIDER_SITE_OTHER): Payer: BC Managed Care – PPO | Admitting: Internal Medicine

## 2017-05-15 ENCOUNTER — Other Ambulatory Visit (INDEPENDENT_AMBULATORY_CARE_PROVIDER_SITE_OTHER): Payer: Self-pay | Admitting: Internal Medicine

## 2017-06-18 ENCOUNTER — Other Ambulatory Visit (INDEPENDENT_AMBULATORY_CARE_PROVIDER_SITE_OTHER): Payer: Self-pay | Admitting: *Deleted

## 2017-06-18 MED ORDER — MESALAMINE ER 500 MG PO CPCR: ORAL_CAPSULE | ORAL | 3 refills | Status: DC

## 2017-07-22 ENCOUNTER — Telehealth (INDEPENDENT_AMBULATORY_CARE_PROVIDER_SITE_OTHER): Payer: Self-pay | Admitting: Internal Medicine

## 2017-07-22 NOTE — Telephone Encounter (Signed)
I called the patient and gave her Dr. Olevia Perches recommendations.

## 2017-07-22 NOTE — Telephone Encounter (Signed)
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  She wants to know if there is anything Dr. Laural Golden can do instead of having to go back to that doctor because there was some issues with what that doctor and the medications with what she wanted her to do verses what Dr. Laural Golden wanted her to do.  Knee is starting to swell and inflamed and since she said it was crohn's induced she wanted to see if this is something he could see her for or recommend somewhere.  [phone]

## 2017-07-22 NOTE — Telephone Encounter (Signed)
Per Dr.Rehman the patient should start with her PCP, consider X-Rays. If it is single jointed , not Crohns' as Crohn's is usually in the small joints. Patient is to be called with this recommendation.

## 2017-07-23 NOTE — Telephone Encounter (Signed)
Noted  

## 2017-07-25 ENCOUNTER — Other Ambulatory Visit (HOSPITAL_COMMUNITY): Payer: Self-pay | Admitting: Physician Assistant

## 2017-07-25 ENCOUNTER — Ambulatory Visit (HOSPITAL_COMMUNITY)
Admission: RE | Admit: 2017-07-25 | Discharge: 2017-07-25 | Disposition: A | Discharge status: Home / Self Care | Payer: BC Managed Care – PPO | Source: Ambulatory Visit | Attending: Physician Assistant | Admitting: Physician Assistant

## 2017-07-25 DIAGNOSIS — M2392 Unspecified internal derangement of left knee: Secondary | ICD-10-CM | POA: Diagnosis not present

## 2017-08-12 ENCOUNTER — Other Ambulatory Visit (HOSPITAL_COMMUNITY): Payer: Self-pay | Admitting: Orthopedic Surgery

## 2017-08-12 DIAGNOSIS — M25562 Pain in left knee: Secondary | ICD-10-CM

## 2017-08-12 DIAGNOSIS — M79662 Pain in left lower leg: Principal | ICD-10-CM

## 2017-08-18 ENCOUNTER — Ambulatory Visit (HOSPITAL_COMMUNITY)
Admission: RE | Admit: 2017-08-18 | Discharge: 2017-08-18 | Disposition: A | Discharge status: Home / Self Care | Payer: BC Managed Care – PPO | Source: Ambulatory Visit | Attending: Orthopedic Surgery | Admitting: Orthopedic Surgery

## 2017-08-18 DIAGNOSIS — M79662 Pain in left lower leg: Secondary | ICD-10-CM

## 2017-08-18 DIAGNOSIS — M25562 Pain in left knee: Secondary | ICD-10-CM | POA: Diagnosis not present

## 2017-08-18 DIAGNOSIS — M659 Synovitis and tenosynovitis, unspecified: Secondary | ICD-10-CM | POA: Insufficient documentation

## 2017-08-18 DIAGNOSIS — M25461 Effusion, right knee: Secondary | ICD-10-CM | POA: Insufficient documentation

## 2017-08-18 DIAGNOSIS — M1712 Unilateral primary osteoarthritis, left knee: Secondary | ICD-10-CM | POA: Diagnosis not present

## 2017-08-18 DIAGNOSIS — M71562 Other bursitis, not elsewhere classified, left knee: Secondary | ICD-10-CM | POA: Insufficient documentation

## 2017-08-18 DIAGNOSIS — M94262 Chondromalacia, left knee: Secondary | ICD-10-CM | POA: Insufficient documentation

## 2017-08-18 DIAGNOSIS — M660 Rupture of popliteal cyst: Secondary | ICD-10-CM | POA: Diagnosis not present

## 2017-08-18 DIAGNOSIS — M25462 Effusion, left knee: Secondary | ICD-10-CM | POA: Insufficient documentation

## 2017-12-10 ENCOUNTER — Encounter (HOSPITAL_COMMUNITY): Payer: Self-pay | Admitting: Emergency Medicine

## 2017-12-10 ENCOUNTER — Ambulatory Visit (HOSPITAL_COMMUNITY)
Admission: EM | Admit: 2017-12-10 | Discharge: 2017-12-10 | Disposition: A | Discharge status: Home / Self Care | Payer: BC Managed Care – PPO | Attending: Internal Medicine | Admitting: Internal Medicine

## 2017-12-10 DIAGNOSIS — M7989 Other specified soft tissue disorders: Secondary | ICD-10-CM | POA: Insufficient documentation

## 2017-12-10 DIAGNOSIS — M79662 Pain in left lower leg: Secondary | ICD-10-CM | POA: Diagnosis not present

## 2017-12-10 MED ORDER — PREDNISONE 50 MG PO TABS: 50.0000 mg | ORAL_TABLET | Freq: Every day | ORAL | 0 refills | Status: AC

## 2017-12-10 NOTE — Discharge Instructions (Signed)
Please go to the main entrance of the hospital and ask for the vascular lab.  Your appointment is at  In the meantime if you have any worsening pain or swelling, develop chest pain, shortness of breath, cough, lightheadedness please go to emergency room  I have sent in a course of prednisone, you may fill this if your pain/swelling is worsening, otherwise follow-up with rheumatology on the 17th.

## 2017-12-10 NOTE — ED Notes (Signed)
Vascular appt at 9am at [facility] 12/11/17

## 2017-12-10 NOTE — ED Provider Notes (Signed)
[facility]    CSN: [id number] Arrival date & time: 12/10/17  1241     History   Chief Complaint Chief Complaint  Patient presents with  . Leg Pain    HPI Tamara Martin is a 42 y.o. female nocturia past medical history presenting today for evaluation of left leg/calf pain and swelling.  Patient states that she has known arthritis in her left knee, last week she was having pain in this knee, has since developed swelling and pain in her left calf.  Denies any new injury.  Has had recent x-rays and MRI-results below.  Patient has felt a tightness and feels like her circulation is soft.  She has not been taking anything for her pain or swelling she has a history of Crohn's and tries to avoid NSAIDs.  Denies previous DVT/PE.  Denies history of smoking.  Denies personal history of cancer.  Denies recent immobilization/hospitalization.  IMPRESSION: 1. Moderate knee effusion with mild synovitis. 2. Moderate size Baker's cyst is partially ruptured. 3. There is a combination of tibial collateral ligament-medial collateral ligament bursitis, mild pes anserine bursitis, and trace MCL bursitis. 4. 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 5. Mild to moderate degenerative chondral thinning in the medial compartment.  HPI  Past Medical History:  Diagnosis Date  . Anxiety 03/04/2013  . Crohn's disease of colon ([place])   . Dermoid cyst of eyebrow 10/2015   left  . 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 Headache, menstrual migraine     Patient Active Problem List   Diagnosis Date Noted  . 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 Encounter for gynecological examination with Papanicolaou smear of cervix 03/14/2016  . Rectocele 03/14/2016  . Anxiety 03/04/2013  . Fatigue 02/24/2013  . Irregular menses 02/24/2013  . History of migraine headaches 10/14/2012  . Crohn's disease ([place]) 08/12/2011  . Arthritis of knee, left  08/12/2011    Past Surgical History:  Procedure Laterality Date  . COLONOSCOPY  06/19/2005  . COLONOSCOPY N/A 08/10/2015   Procedure: COLONOSCOPY;  Surgeon: Rogene Houston, MD;  Location: [facility];  Service: Endoscopy;  Laterality: N/A;  1:25  . EYE SURGERY     benign tumor  . MASS EXCISION Left 10/27/2015   Procedure: EXCISION MASS LEFT BROW ( EXCISION OF SUBMUSCULAR MASS LEFT BROW 2.5CM);  Surgeon: Irene Limbo, MD;  Location: [facility];  Service: Plastics;  Laterality: Left;    OB History    Gravida  4   Para  3   Term      Preterm      AB  1   Living  3     SAB  1   TAB      Ectopic      Multiple      Live Births  3            Home Medications    Prior to Admission medications   Medication Sig Start Date End Date Taking? Authorizing Provider  b complex vitamins capsule Take 1 capsule by mouth daily.    [provider]  Calcium-Phosphorus-Vitamin D (CALCIUM GUMMIES PO) Take by mouth 2 (two) times daily.    [provider]  cholecalciferol (VITAMIN D) 1000 units tablet Take 5,000 Units by mouth daily.    [provider]  FIBER SELECT GUMMIES PO Take 1 tablet by mouth daily.    [provider]  mesalamine (CANASA) 1000 MG  suppository PLACE 1 SUPPOSITORY (1,000 MG TOTAL) RECTALLY AT BEDTIME. 09/11/16   Setzer, Terri L, NP  mesalamine (PENTASA) 500 MG CR capsule TAKE 2 CAPSULES BY MOUTH 4 TIMES A DAY 06/18/17   Rehman, Mechele Dawley, MD  Multiple Vitamins-Minerals (MULTI ADULT GUMMIES PO) Take by mouth daily.    [provider]  predniSONE (DELTASONE) 50 MG tablet Take 1 tablet (50 mg total) by mouth daily for 5 days. 12/10/17 12/15/17  Audrielle Vankuren, Elesa Hacker, PA-C    Family History Family History  Problem Relation Age of Onset  . Atrial fibrillation Mother   . Cirrhosis Father   . Cancer Father        stomach and bone  . Cancer Maternal Aunt        lung  . Cancer Maternal Uncle        bones  .  Diabetes Maternal Grandmother   . Hypertension Maternal Grandmother   . Diabetes Maternal Grandfather     Social History Social History   Tobacco Use  . Smoking status: Never Smoker  . Smokeless tobacco: Never Used  Substance Use Topics  . Alcohol use: No    Alcohol/week: 0.0 oz  . Drug use: No     Allergies   Penicillins   Review of Systems Review of Systems  Constitutional: Negative for fatigue and fever.  Respiratory: Negative for cough and shortness of breath.   Cardiovascular: Positive for leg swelling. Negative for chest pain.  Gastrointestinal: Negative for abdominal pain, nausea and vomiting.  Musculoskeletal: Positive for arthralgias, gait problem, joint swelling and myalgias. Negative for back pain.  Skin: Positive for color change. Negative for wound.  Neurological: Negative for dizziness, speech difficulty, weakness, light-headedness, numbness and headaches.     Physical Exam Triage Vital Signs ED Triage Vitals [12/10/17 1303]  Enc Vitals Group     BP 130/84     Pulse Rate 81     Resp 18     Temp 98.2 F (36.8 C)     Temp Source Oral     SpO2 99 %     Weight      Height      Head Circumference      Peak Flow      Pain Score      Pain Loc      Pain Edu?      Excl. in [place]?    No data found.  Updated Vital Signs BP 130/84 (BP Location: Left Arm)   Pulse 81   Temp 98.2 F (36.8 C) (Oral)   Resp 18   SpO2 99%   Visual Acuity Right Eye Distance:   Left Eye Distance:   Bilateral Distance:    Right Eye Near:   Left Eye Near:    Bilateral Near:     Physical Exam  Constitutional: She is oriented to person, place, and time. She appears well-developed and well-nourished. No distress.  HENT:  Head: Normocephalic and atraumatic.  Eyes: Conjunctivae are normal.  Neck: Neck supple.  Cardiovascular: Normal rate and regular rhythm.  No murmur heard. Pulmonary/Chest: Effort normal and breath sounds normal. No respiratory distress.    Musculoskeletal: She exhibits no edema.  Mild erythema overlying posterior left calf, tenderness to palpation along medial lateral aspect of calf, no palpable cord.  Dorsalis pedis 2+, cap refill less than 2 seconds.  Toes do not appear increasingly erythematous compared to right.  Knee without tenderness to popliteal area.  Full active range of motion.  Mild  joint effusion.  Negative Homans  Neurological: She is alert and oriented to person, place, and time.  Skin: Skin is warm and dry.  Psychiatric: She has a normal mood and affect.  Nursing note and vitals reviewed.    UC Treatments / Results  Labs (all labs ordered are listed, but only abnormal results are displayed) Labs Reviewed - No data to display  EKG None  Radiology No results found.  Procedures Procedures (including critical care time)  Medications Ordered in UC Medications - No data to display  Initial Impression / Assessment and Plan / UC Course  I have reviewed the triage vital signs and the nursing notes.  Pertinent labs & imaging results that were available during my care of the patient were reviewed by me and considered in my medical decision making (see chart for details).     Patient with left calf pain and swelling, most likely related to Baker's cyst seen on MRI previously.  Given pain and swelling, will send for DVT ultrasound to rule out DVT.  Patient wants to avoid anti-inflammatories due to her Crohn's, will provide a course of prednisone to take.Discussed strict return precautions. Patient verbalized understanding and is agreeable with plan.  Final Clinical Impressions(s) / UC Diagnoses   Final diagnoses:  Pain of left calf  Left leg swelling     Discharge Instructions     Please go to the main entrance of the hospital and ask for the vascular lab.  Your appointment is at  In the meantime if you have any worsening pain or swelling, develop chest pain, shortness of breath, cough,  lightheadedness please go to emergency room  I have sent in a course of prednisone, you may fill this if your pain/swelling is worsening, otherwise follow-up with rheumatology on the 17th.   ED Prescriptions    Medication Sig Dispense Auth. Provider   predniSONE (DELTASONE) 50 MG tablet Take 1 tablet (50 mg total) by mouth daily for 5 days. 5 tablet Abygayle Deltoro C, PA-C     Controlled Substance Prescriptions [place] Controlled Substance Registry consulted? Not Applicable   Janith Lima, [place] 12/10/17 1326

## 2017-12-10 NOTE — ED Triage Notes (Signed)
Pt sts left knee pain and swelling into calf; CMS intact

## 2017-12-11 ENCOUNTER — Ambulatory Visit (HOSPITAL_BASED_OUTPATIENT_CLINIC_OR_DEPARTMENT_OTHER)
Admit: 2017-12-11 | Discharge: 2017-12-11 | Disposition: A | Discharge status: Home / Self Care | Payer: BC Managed Care – PPO | Attending: Internal Medicine | Admitting: Internal Medicine

## 2017-12-11 DIAGNOSIS — M79605 Pain in left leg: Secondary | ICD-10-CM | POA: Diagnosis present

## 2017-12-11 DIAGNOSIS — M79609 Pain in unspecified limb: Secondary | ICD-10-CM

## 2017-12-11 DIAGNOSIS — M7989 Other specified soft tissue disorders: Secondary | ICD-10-CM | POA: Diagnosis not present

## 2017-12-11 DIAGNOSIS — M79662 Pain in left lower leg: Secondary | ICD-10-CM | POA: Diagnosis not present

## 2017-12-11 NOTE — Progress Notes (Addendum)
Preliminary results by tech - Left Lower Ext. Venous Duplex Completed. Negative for deep and superficial vein thrombosis and Baker's cyst. Oda Cogan, BS, RDMS, RVT

## 2018-06-22 ENCOUNTER — Ambulatory Visit: Payer: BC Managed Care – PPO | Admitting: Adult Health

## 2018-06-22 ENCOUNTER — Encounter: Payer: Self-pay | Admitting: Adult Health

## 2018-06-22 ENCOUNTER — Telehealth: Payer: Self-pay | Admitting: *Deleted

## 2018-06-22 VITALS — BP 121/74 | HR 73 | Ht 65.0 in | Wt 154.0 lb

## 2018-06-22 DIAGNOSIS — R309 Painful micturition, unspecified: Secondary | ICD-10-CM

## 2018-06-22 DIAGNOSIS — R350 Frequency of micturition: Secondary | ICD-10-CM

## 2018-06-22 DIAGNOSIS — R319 Hematuria, unspecified: Secondary | ICD-10-CM | POA: Diagnosis not present

## 2018-06-22 LAB — POCT URINALYSIS DIPSTICK
GLUCOSE UA: NEGATIVE
Ketones, UA: NEGATIVE
LEUKOCYTES UA: NEGATIVE
Nitrite, UA: NEGATIVE
PROTEIN UA: NEGATIVE

## 2018-06-22 MED ORDER — PHENAZOPYRIDINE HCL 200 MG PO TABS
200.0000 mg | ORAL_TABLET | Freq: Three times a day (TID) | ORAL | 0 refills | Status: DC | PRN
Start: 2018-06-22 — End: 2018-09-15

## 2018-06-22 MED ORDER — SULFAMETHOXAZOLE-TRIMETHOPRIM 800-160 MG PO TABS: 1.0000 | ORAL_TABLET | Freq: Two times a day (BID) | ORAL | 0 refills | Status: DC

## 2018-06-22 NOTE — Progress Notes (Signed)
Patient ID: Tamara Martin, female   DOB: 1975-07-21, 42 y.o.   MRN: [id number]

## 2018-06-22 NOTE — Progress Notes (Signed)
Patient ID: Tamara Martin, female   DOB: 1976/01/21, 42 y.o.   MRN: [id number] History of Present Illness: Tamara Martin is a 42 year old white female, married, worked, in for ?[employer]. Has pain with urination esp at end of her stream and frequency, for about 2 days now.    Current Medications, Allergies, Past Medical History, Past Surgical History, Family History and Social History were reviewed in [employer] record.     Review of Systems:  Has pain with urination at end of stream +urinary frequency for about 2 days  No pain with sex, last time had sex   Physical Exam:BP 121/74 (BP Location: Left Arm, Patient Position: Sitting, Cuff Size: Normal)   Pulse 73   Ht 5' 5"  (1.651 m)   Wt 154 lb (69.9 kg)   LMP 06/19/2018 (Exact Date)   BMI 25.63 kg/m  urine dipstick +large blood General:  Well developed, well nourished, no acute distress Skin:  Warm and dry Lungs; Clear to auscultation bilaterally Cardiovascular: Regular rate and rhythm No CVAT noted Psych:  No mood changes, alert and cooperative,seems happy Fall risk is low.  Impression:  1. Pain with urination   2. Frequent urination   3. Hematuria, unspecified type      Plan:  UA C&S sent Meds ordered this encounter  Medications  . sulfamethoxazole-trimethoprim (BACTRIM DS,SEPTRA DS) 800-160 MG tablet    Sig: Take 1 tablet by mouth 2 (two) times daily. Take 1 bid    Dispense:  14 tablet    Refill:  0    Order Specific Question:   Supervising Provider    Answer:   Elonda Husky, LUTHER H [[id number]]  . phenazopyridine (PYRIDIUM) 200 MG tablet    Sig: Take 1 tablet (200 mg total) by mouth 3 (three) times daily as needed for pain.    Dispense:  10 tablet    Refill:  0    Order Specific Question:   Supervising Provider    Answer:   Florian Buff [[id number]]  Push fluids F/U prn

## 2018-06-22 NOTE — Telephone Encounter (Signed)
?  [employer] to come in today at 11:30

## 2018-06-23 LAB — URINALYSIS, ROUTINE W REFLEX MICROSCOPIC
Bilirubin, UA: NEGATIVE
GLUCOSE, UA: NEGATIVE
KETONES UA: NEGATIVE
Leukocytes, UA: NEGATIVE
NITRITE UA: NEGATIVE
Protein, UA: NEGATIVE
RBC, UA: NEGATIVE
Specific Gravity, UA: 1.008 (ref 1.005–1.030)
UUROB: 0.2 mg/dL (ref 0.2–1.0)
pH, UA: 6 (ref 5.0–7.5)

## 2018-06-24 LAB — URINE CULTURE

## 2018-06-26 ENCOUNTER — Other Ambulatory Visit (HOSPITAL_COMMUNITY): Payer: Self-pay | Admitting: Family Medicine

## 2018-06-26 ENCOUNTER — Ambulatory Visit (HOSPITAL_COMMUNITY)
Admission: RE | Admit: 2018-06-26 | Discharge: 2018-06-26 | Disposition: A | Discharge status: Home / Self Care | Payer: BC Managed Care – PPO | Source: Ambulatory Visit | Attending: Family Medicine | Admitting: Family Medicine

## 2018-06-26 DIAGNOSIS — R0609 Other forms of dyspnea: Secondary | ICD-10-CM | POA: Diagnosis not present

## 2018-09-15 ENCOUNTER — Ambulatory Visit (INDEPENDENT_AMBULATORY_CARE_PROVIDER_SITE_OTHER): Payer: BC Managed Care – PPO | Admitting: Internal Medicine

## 2018-09-15 ENCOUNTER — Encounter (INDEPENDENT_AMBULATORY_CARE_PROVIDER_SITE_OTHER): Payer: Self-pay | Admitting: Internal Medicine

## 2018-09-15 VITALS — BP 118/80 | HR 80 | Temp 98.6°F | Resp 18 | Ht 65.0 in | Wt 156.0 lb

## 2018-09-15 DIAGNOSIS — K5080 Crohn's disease of both small and large intestine without complications: Secondary | ICD-10-CM

## 2018-09-15 NOTE — Patient Instructions (Addendum)
Notify if you have rectal bleeding or diarrhea. Will request copy of recent blood work from Dr. Nolon Rod office for review

## 2018-09-15 NOTE — Progress Notes (Signed)
Presenting complaint;  Follow-up for Crohn's disease.  Database and subjective:  Tamara Martin is 43 year old Caucasian female who was over 22-year history of Crohn's disease involving terminal ileum and rectum who is here for scheduled visit.  She was last seen in March 2018.  She had surveillance colonoscopy in February 2017.  Terminal ileum was normal and she had mild rectal disease.  She has been maintained on oral mesalamine along with topical mesalamine or Canasa which she has not used in several months.  Patient says she is doing well.  Several weeks ago she developed constipation but she has changed her eating habits and she is having formed stools daily.  She denies abdominal pain melena or rectal bleeding.  She has lost 5 pounds since her last visit.  She states she actually had gained more weight and now she is trying to lose some.  She is having no side effects with Pentasa. Patient was having left knee pain and effusion.  Her knee symptoms go back to 7 years when she was treated with sulfasalazine but developed side effects and stopped the medication.  Last year she was seen by orthopedic surgeon and treated with 4 rounds of prednisone but it did not help.  Since she has been in [place] her knee pain has completely resolved.  She sees Ms. Leafy Kindle, PA at Dr. Melissa Noon office.  While she is not having any side effects with Humira she remains concerned about potential issues.  She also has been having pain in her hands.  She says she had difficulty writing but has no problem typing.  Since she has been in her marriage she is having less pain.  She has not had any x-rays to determine if she has small joint inflammation. She had blood work at Dr. Nolon Rod office few months ago and she says everything was normal   Current Medications: Outpatient Encounter Medications as of 09/15/2018  Medication Sig  . Adalimumab (HUMIRA PEN) 40 MG/0.8ML PNKT Inject into the skin every 14 (fourteen) days.   Marland Kitchen  b complex vitamins capsule Take 1 capsule by mouth daily.  . Calcium-Phosphorus-Vitamin D (CALCIUM GUMMIES PO) Take by mouth 2 (two) times daily.  Marland Kitchen FIBER SELECT GUMMIES PO Take 1 tablet by mouth daily.  . mesalamine (PENTASA) 500 MG CR capsule TAKE 2 CAPSULES BY MOUTH 4 TIMES A DAY  . Multiple Vitamins-Minerals (MULTI ADULT GUMMIES PO) Take by mouth daily.  . [DISCONTINUED] mesalamine (CANASA) 1000 MG suppository PLACE 1 SUPPOSITORY (1,000 MG TOTAL) RECTALLY AT BEDTIME.  . [DISCONTINUED] phenazopyridine (PYRIDIUM) 200 MG tablet Take 1 tablet (200 mg total) by mouth 3 (three) times daily as needed for pain. (Patient not taking: Reported on 09/15/2018)  . [DISCONTINUED] sulfamethoxazole-trimethoprim (BACTRIM DS,SEPTRA DS) 800-160 MG tablet Take 1 tablet by mouth 2 (two) times daily. Take 1 bid (Patient not taking: Reported on 09/15/2018)   No facility-administered encounter medications on file as of 09/15/2018.      Objective: Blood pressure 118/80, pulse 80, temperature 98.6 F (37 C), temperature source Oral, resp. rate 18, height 5' 5"  (1.651 m), weight 156 lb (70.8 kg), last menstrual period 09/05/2018. Patient is alert and in no acute distress. Conjunctiva is pink. Sclera is nonicteric Oropharyngeal mucosa is normal. No neck masses or thyromegaly noted. Cardiac exam with regular rhythm normal S1 and S2. No murmur or gallop noted. Lungs are clear to auscultation. Abdomen is symmetrical soft and nontender with organomegaly or masses. No LE edema or clubbing noted.  Assessment:  #  1.  Crohn's disease involving terminal ileum and rectum.  Disease duration about 22 years.  Last colonoscopy was in February 2017 revealing mild rectal disease.  Mild disease to begin with.  He remains on oral mesalamine and now she is on Humira.  Oral mesalamine but will for now.  #2.  History of seronegative arthritis primarily involving left knee.  She has had excellent response to Humira.   Plan:  Patient  really showed about continued use with Humira/adalimumab. I told her my experience with this medication has been very favorable.  She will stay on this medication until it quits working or she develops antibodies or if she has an infection or other event. She will continue Pentasa at current dose. Will request copy of recent blood work from Dr. Nolon Rod office. Office visit in 1 year.

## 2019-01-20 ENCOUNTER — Telehealth: Payer: Self-pay | Admitting: Adult Health

## 2019-01-20 NOTE — Telephone Encounter (Signed)
Tamara Martin would like a call from [place] in  Ref to a muscle relaxer that you have given her before for headache during cycle.  I told her Danise Mina was out of office til Thurs and she said that was fine to call thurs or Friday

## 2019-01-21 MED ORDER — CYCLOBENZAPRINE HCL 10 MG PO TABS: 10.0000 mg | ORAL_TABLET | Freq: Three times a day (TID) | ORAL | 1 refills | Status: DC | PRN

## 2019-01-21 NOTE — Telephone Encounter (Signed)
Pt requesting on flexeril, it helps with headaches, will rx flexeril

## 2019-01-21 NOTE — Addendum Note (Signed)
Addended by: Derrek Monaco A on: 01/21/2019 04:31 PM   Modules accepted: Orders

## 2019-01-29 ENCOUNTER — Other Ambulatory Visit (INDEPENDENT_AMBULATORY_CARE_PROVIDER_SITE_OTHER): Payer: Self-pay | Admitting: Internal Medicine

## 2019-06-11 ENCOUNTER — Other Ambulatory Visit: Payer: Self-pay

## 2019-06-11 DIAGNOSIS — Z20822 Contact with and (suspected) exposure to covid-19: Secondary | ICD-10-CM

## 2019-06-14 LAB — NOVEL CORONAVIRUS, NAA: SARS-CoV-2, NAA: NOT DETECTED

## 2019-09-14 ENCOUNTER — Other Ambulatory Visit: Payer: Self-pay

## 2019-09-14 ENCOUNTER — Ambulatory Visit (INDEPENDENT_AMBULATORY_CARE_PROVIDER_SITE_OTHER): Payer: BC Managed Care – PPO | Admitting: Internal Medicine

## 2019-09-14 ENCOUNTER — Encounter (INDEPENDENT_AMBULATORY_CARE_PROVIDER_SITE_OTHER): Payer: Self-pay | Admitting: Internal Medicine

## 2019-09-14 VITALS — BP 108/75 | HR 80 | Temp 97.2°F | Ht 65.0 in | Wt 163.4 lb

## 2019-09-14 DIAGNOSIS — K5080 Crohn's disease of both small and large intestine without complications: Secondary | ICD-10-CM | POA: Diagnosis not present

## 2019-09-14 NOTE — Progress Notes (Signed)
Presenting complaint;  Crohn's disease.  Database and subjective:  Patient is 44 year old Caucasian female was 23-year history of ileal and rectal Crohn's disease who is here for scheduled visit.  She was last seen 1 year ago.  She is found to have mild disease and has done well with mesalamine.  Her last colonoscopy was in January 2017.  She states she is doing well.  She tested positive for Covid in December 2021 along with rest of her family members.  She states she had fever of as high as 102 and fatigue but no respiratory symptoms.  When this occurred she is skipped her Romero dose.  All in all she was off for 4 weeks.  Now she is back on it.  She is having 1-2 formed stools daily.  She denies abdominal pain melena or rectal bleeding.  Only time she saw bleeding was when she took fiber-containing prebiotic.  It was only a small amount of blood.  Her appetite is good.  She has gained 7 pounds since her last visit.  She says she has cut back on her walking because of left knee pain.   Current Medications: Outpatient Encounter Medications as of 09/14/2019  Medication Sig  . Adalimumab (HUMIRA PEN) 40 MG/0.8ML PNKT Inject into the skin every 14 (fourteen) days.   Marland Kitchen b complex vitamins capsule Take 1 capsule by mouth daily.  . Calcium-Phosphorus-Vitamin D (CALCIUM GUMMIES PO) Take by mouth 2 (two) times daily.  . cyclobenzaprine (FLEXERIL) 10 MG tablet Take 1 tablet (10 mg total) by mouth every 8 (eight) hours as needed for muscle spasms.  Marland Kitchen FIBER SELECT GUMMIES PO Take 1 tablet by mouth daily.  . mesalamine (PENTASA) 500 MG CR capsule TAKE 2 CAPSULES BY MOUTH 4 TIMES A DAY  . Multiple Vitamins-Minerals (MULTI ADULT GUMMIES PO) Take by mouth daily.  . Omega-3 Fatty Acids (FISH OIL) 1000 MG CAPS Take 1,000 mg by mouth daily.  . Turmeric (QC TUMERIC COMPLEX PO) Take by mouth daily.   No facility-administered encounter medications on file as of 09/14/2019.     Objective: Blood pressure 108/75,  pulse 80, temperature (!) 97.2 F (36.2 C), temperature source Temporal, height 5' 5"  (1.651 m), weight 163 lb 6.4 oz (74.1 kg). Patient is alert and in no acute distress. She is wearing a facial mask. Conjunctiva is pink. Sclera is nonicteric Oropharyngeal mucosa is normal. No neck masses or thyromegaly noted. Cardiac exam with regular rhythm normal S1 and S2. No murmur or gallop noted. Lungs are clear to auscultation. Abdomen is soft and nontender with no organomegaly or masses. No LE edema or clubbing noted.  Assessment:  #1.  Crohn's disease involving terminal ileum and rectum.  She had mild proctitis at the time of colonoscopy in January 2017 and she was treated with mesalamine suppositories.  She remains on oral mesalamine and is doing well.  She is due for blood work.  Plan:  Continue Pentasa at current dose of 1 g p.o. 4 times daily. CBC with differential, comprehensive chemistry panel and CRP. Office visit in 1 year at which time we will consider surveillance colonoscopy.

## 2019-09-14 NOTE — Patient Instructions (Signed)
Physician will call with the results of blood work when copy received

## 2019-11-30 ENCOUNTER — Ambulatory Visit (INDEPENDENT_AMBULATORY_CARE_PROVIDER_SITE_OTHER): Payer: BC Managed Care – PPO | Admitting: Gastroenterology

## 2019-11-30 ENCOUNTER — Encounter (INDEPENDENT_AMBULATORY_CARE_PROVIDER_SITE_OTHER): Payer: Self-pay | Admitting: Gastroenterology

## 2019-11-30 ENCOUNTER — Other Ambulatory Visit: Payer: Self-pay

## 2019-11-30 VITALS — BP 107/71 | HR 69 | Temp 97.3°F | Ht 65.0 in | Wt 142.4 lb

## 2019-11-30 DIAGNOSIS — K5080 Crohn's disease of both small and large intestine without complications: Secondary | ICD-10-CM

## 2019-11-30 NOTE — Patient Instructions (Signed)
We checking labs today - continue pentasa and canasa until we call w/ results

## 2019-11-30 NOTE — Progress Notes (Signed)
Patient profile: Tamara Martin is a 44 y.o. female seen for evaluation of stomach issues.  She was last seen in clinic in March 2021-she had previously been diagnosed with Covid and had skipped doses of Humira but was doing well at her last office follow-up.  Is also maintained on Pentasa    History of Present Illness: Tamara Martin is seen today for new onset of symptoms since her last visit.  She reports shortly after her last visit trying a new diet called "Harle Battiest" which is a powdered-based diet with 1 normal meal a day.  When she started that she developed constipation without having a bowel movement for several days, she then ultimately felt impacted and began using laxative suppositories which caused diarrhea, this cycled for a couple weeks and she stopped the diet.  With the change in bowel habit she also had increased mucus with a small amount of blood.  She also called our office and was sent in Canasa suppositories.  She has been taking these 1 to 2 weeks and is seen complete normalization of her bowel habits back to once a day.  Reports having improvement after 2-3 doses of Canasa.  No blood recently.  She has had some mild abdominal pain that resolved with stopping the diet and starting the Canasa suppositories.  She has a chronic history of of Crohn's disease and has been on Humira for about 1 year, started Humira for swelling and pain in her knee which completely resolved with Humira.  Recently she has had several timeframes where she has been off Humira 1 to 2 months at a time after Covid exposures.  She has not had any flare of her symptoms with coming off the Humira.  Reports her last emeritus was 2 months ago.  Wt Readings from Last 3 Encounters:  11/30/19 142 lb 6.4 oz (64.6 kg)  09/14/19 163 lb 6.4 oz (74.1 kg)  09/15/18 156 lb (70.8 kg)     Last Colonoscopy: 2017-Prep excellent. Normal mucosa of terminal ileum. Normal mucosa of cecum, ascending colon, hepatic flexure,  transverse colon, splenic flexure, descending and sigmoid colon. Mucosa of proximal rectum was normal. Erythema and mucosal friability noted to distal rectal mucosa. Small hemorrhoids below the dentate line.   Last Endoscopy:    Past Medical History:  Past Medical History:  Diagnosis Date  . Anxiety 03/04/2013  . Crohn's disease of colon ([place])   . Dermoid cyst of eyebrow 10/2015   left  . 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 Headache, menstrual migraine     Problem List: Patient Active Problem List   Diagnosis Date Noted  . 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 Encounter for gynecological examination with Papanicolaou smear of cervix 03/14/2016  . Rectocele 03/14/2016  . Anxiety 03/04/2013  . Fatigue 02/24/2013  . Irregular menses 02/24/2013  . History of migraine headaches 10/14/2012  . Crohn's disease ([place]) 08/12/2011  . Arthritis of knee, left 08/12/2011    Past Surgical History: Past Surgical History:  Procedure Laterality Date  . COLONOSCOPY  06/19/2005  . COLONOSCOPY N/A 08/10/2015   Procedure: COLONOSCOPY;  Surgeon: Rogene Houston, MD;  [facility];  Service: Endoscopy;  Laterality: N/A;  1:25  . EYE SURGERY     benign tumor  . MASS EXCISION Left 10/27/2015   Procedure: EXCISION MASS LEFT BROW ( EXCISION OF SUBMUSCULAR MASS LEFT BROW 2.5CM);  Surgeon: Irene Limbo, MD;  Location: [facility];  Service: Plastics;  Laterality: Left;    Allergies: Allergies  Allergen Reactions  . Penicillins Hives           Home Medications:  Current Outpatient Medications:  .  b complex vitamins capsule, Take 1 capsule by mouth daily., Disp: , Rfl:  .  Calcium-Phosphorus-Vitamin D (CALCIUM GUMMIES PO), Take by mouth daily. , Disp: , Rfl:  .  cyclobenzaprine (FLEXERIL) 10 MG tablet, Take 1 tablet (10 mg total) by mouth every 8 (eight) hours as needed for muscle spasms., Disp: 30 tablet, Rfl: 1 .  FIBER SELECT GUMMIES PO, Take 1 tablet by  mouth daily., Disp: , Rfl:  .  mesalamine (PENTASA) 500 MG CR capsule, TAKE 2 CAPSULES BY MOUTH 4 TIMES A DAY, Disp: 720 capsule, Rfl: 3 .  Multiple Vitamins-Minerals (MULTI ADULT GUMMIES PO), Take by mouth daily., Disp: , Rfl:  .  Omega-3 Fatty Acids (FISH OIL) 1000 MG CAPS, Take 1,000 mg by mouth daily., Disp: , Rfl:  .  Probiotic Product (PROBIOTIC PO), Take by mouth daily., Disp: , Rfl:  .  Turmeric (QC TUMERIC COMPLEX PO), Take by mouth daily., Disp: , Rfl:  .  Adalimumab (HUMIRA PEN) 40 MG/0.8ML PNKT, Inject into the skin every 14 (fourteen) days. , Disp: , Rfl:    Family History: family history includes Atrial fibrillation in her mother; Cancer in her father, maternal aunt, and maternal uncle; Cirrhosis in her father; Diabetes in her maternal grandfather and maternal grandmother; Hypertension in her maternal grandmother.    Social History:   reports that she has never smoked. She has never used smokeless tobacco. She reports that she does not drink alcohol or use drugs.   Review of Systems: Constitutional: Denies weight loss/weight gain  Eyes: No changes in vision. ENT: No oral lesions, sore throat.  GI: see HPI.  Heme/Lymph: No easy bruising.  CV: No chest pain.  GU: No hematuria.  Integumentary: No rashes.  Neuro: No headaches.  Psych: No depression/anxiety.  Endocrine: No heat/cold intolerance.  Allergic/Immunologic: No urticaria.  Resp: No cough, SOB.  Musculoskeletal: No joint swelling.    Physical Examination: BP 107/71 (BP Location: Right Arm, Patient Position: Sitting, Cuff Size: Large)   Pulse 69   Temp (!) 97.3 F (36.3 C) (Temporal)   Ht _0  (1.651 m)   Wt 142 lb 6.4 oz (64.6 kg)   BMI 23.70 kg/m  Gen: NAD, alert and oriented x 4 HEENT: PEERLA, EOMI, Neck: supple, no JVD Chest: CTA bilaterally, no wheezes, crackles, or other adventitious sounds CV: RRR, no m/g/c/r Abd: soft, NT, ND, +BS in all four quadrants; no HSM, guarding, ridigity, or rebound  tenderness Ext: no edema, well perfused with 2+ pulses, Skin: no rash or lesions noted on observed skin Lymph: no noted LAD  Data Reviewed:   Checking labs today-patient reports last labs over a year ago  Assessment/Plan: Ms. Bouie is a 44 y.o. female    Danee was seen today for follow-up.  Diagnoses and all orders for this visit:  Crohn's disease of both small and large intestine without complication ([place]) -     CBC with Differential -     COMPLETE METABOLIC PANEL WITH GFR -     Sed Rate (ESR) -     C-reactive protein    1. Crohn's -recent flare of symptoms when patient started a powder-based diet, she has stopped the diet and returned to her normal diet as well as started Canasa and symptoms have completely resolved.  We will have  her complete a 1 month course of Canasa suppositories.  She is to contact me if symptoms return after stopping the suppositories. Check labs including inflammatory markers today  Patient has had to stop and start Humira multiple times due to having Covid in December as well as exposure and quarantining for Covid over the past few months.  Her last Humira vaccine was 2 months ago.  She had started the Humira for knee pain in 2020 and has not had return of any joint symptoms.  Given concern for developing antibodies with intermittent Humira use we will have her stop Humira and continue Pentasa.  Case discussed in detail with Dr. Laural Golden & patient was called after visit with updated recommendation   F/up in office 6 months, call sooner if needed     I personally performed the service, non-incident to. (WP)  Laurine Blazer, [facility]

## 2019-12-01 LAB — COMPLETE METABOLIC PANEL WITH GFR
AG Ratio: 2 (calc) (ref 1.0–2.5)
ALT: 11 U/L (ref 6–29)
AST: 13 U/L (ref 10–30)
Albumin: 4.5 g/dL (ref 3.6–5.1)
Alkaline phosphatase (APISO): 60 U/L (ref 31–125)
BUN: 12 mg/dL (ref 7–25)
CO2: 30 mmol/L (ref 20–32)
Calcium: 9.4 mg/dL (ref 8.6–10.2)
Chloride: 103 mmol/L (ref 98–110)
Creat: 0.71 mg/dL (ref 0.50–1.10)
GFR, Est African American: 121 mL/min/{1.73_m2} (ref >=60)
GFR, Est Non African American: 104 mL/min/{1.73_m2} (ref >=60)
Globulin: 2.3 g/dL (calc) (ref 1.9–3.7)
Glucose, Bld: 81 mg/dL (ref 65–99)
Potassium: 4 mmol/L (ref 3.5–5.3)
Sodium: 139 mmol/L (ref 135–146)
Total Bilirubin: 0.3 mg/dL (ref 0.2–1.2)
Total Protein: 6.8 g/dL (ref 6.1–8.1)

## 2019-12-01 LAB — CBC WITH DIFFERENTIAL/PLATELET
Absolute Monocytes: 562 cells/uL (ref 200–950)
Basophils Absolute: 52 cells/uL (ref 0–200)
Basophils Relative: 0.7 %
Eosinophils Absolute: 141 cells/uL (ref 15–500)
Eosinophils Relative: 1.9 %
HCT: 41.6 % (ref 35.0–45.0)
Hemoglobin: 13.6 g/dL (ref 11.7–15.5)
Lymphs Abs: 2250 cells/uL (ref 850–3900)
MCH: 29.3 pg (ref 27.0–33.0)
MCHC: 32.7 g/dL (ref 32.0–36.0)
MCV: 89.7 fL (ref 80.0–100.0)
MPV: 10.6 fL (ref 7.5–12.5)
Monocytes Relative: 7.6 %
Neutro Abs: 4396 cells/uL (ref 1500–7800)
Neutrophils Relative %: 59.4 %
Platelets: 291 10*3/uL (ref 140–400)
RBC: 4.64 10*6/uL (ref 3.80–5.10)
RDW: 12.5 % (ref 11.0–15.0)
Total Lymphocyte: 30.4 %
WBC: 7.4 10*3/uL (ref 3.8–10.8)

## 2019-12-01 LAB — C-REACTIVE PROTEIN: CRP: 0.3 mg/L (ref <8.0)

## 2019-12-01 LAB — SEDIMENTATION RATE: Sed Rate: 2 mm/h (ref 0–20)

## 2019-12-20 ENCOUNTER — Ambulatory Visit (INDEPENDENT_AMBULATORY_CARE_PROVIDER_SITE_OTHER): Payer: BC Managed Care – PPO | Admitting: Gastroenterology

## 2020-01-11 ENCOUNTER — Telehealth (INDEPENDENT_AMBULATORY_CARE_PROVIDER_SITE_OTHER): Payer: Self-pay | Admitting: Gastroenterology

## 2020-01-11 NOTE — Telephone Encounter (Signed)
Tamara Martin-can you check if she is having joint pain since off Humira and when it began? Not sure which arthritis medicine she is referring to. Thanks!

## 2020-01-11 NOTE — Telephone Encounter (Signed)
Patient called stated she is having some joint pain since she has been off of her arthritis medication - please advise - [phone]

## 2020-01-12 NOTE — Telephone Encounter (Signed)
Patient left voice mail message - she returned the call

## 2020-01-12 NOTE — Telephone Encounter (Signed)
Returned pt called ,lvm for patient to call us back.

## 2020-01-12 NOTE — Telephone Encounter (Signed)
Patient returned call stating her joint pain started this past weekend and she is not taking Humira - please advise - [phone]

## 2020-01-12 NOTE — Telephone Encounter (Signed)
Spoke with patient she said that she d/c the Humira  since she has had swelling and pain in her knee and ankle. She notice that the swelling is get worse though out the day. She wants to know if she needs dose of prednisone, because in the past she has to have fluid taken out of knee. I told patient I would call her back once you have review the note. Please advise.

## 2020-01-13 NOTE — Telephone Encounter (Signed)
I called pt - over weekend (6 days ago) having initially knee pain/swelling then knee pain improved and now having ankle pain. She has tried to stay off her feet without improvement. She has reached out to rheum as well and awaiting return call  Her bowels are doing well-taking miralax daily. No rectal bleeding or abd pain.  No GI symptoms.  She last had Humira approximately 3 months ago.  Given she has only currently having joint symptoms recommend she discuss a treatment plan with rheumatology.  Did ask her to contact us after she discusses current symptoms with them for update.

## 2020-03-25 ENCOUNTER — Other Ambulatory Visit (INDEPENDENT_AMBULATORY_CARE_PROVIDER_SITE_OTHER): Payer: Self-pay | Admitting: Internal Medicine

## 2020-04-11 ENCOUNTER — Other Ambulatory Visit (HOSPITAL_COMMUNITY)
Admission: RE | Admit: 2020-04-11 | Discharge: 2020-04-11 | Disposition: A | Discharge status: Home / Self Care | Payer: BC Managed Care – PPO | Source: Ambulatory Visit | Attending: Adult Health | Admitting: Adult Health

## 2020-04-11 ENCOUNTER — Encounter: Payer: Self-pay | Admitting: Adult Health

## 2020-04-11 ENCOUNTER — Other Ambulatory Visit (HOSPITAL_COMMUNITY): Payer: Self-pay | Admitting: Adult Health

## 2020-04-11 ENCOUNTER — Ambulatory Visit (INDEPENDENT_AMBULATORY_CARE_PROVIDER_SITE_OTHER): Payer: BC Managed Care – PPO | Admitting: Adult Health

## 2020-04-11 VITALS — BP 117/74 | HR 80 | Ht 65.0 in | Wt 141.0 lb

## 2020-04-11 DIAGNOSIS — Z01419 Encounter for gynecological examination (general) (routine) without abnormal findings: Secondary | ICD-10-CM

## 2020-04-11 DIAGNOSIS — Z1211 Encounter for screening for malignant neoplasm of colon: Secondary | ICD-10-CM | POA: Insufficient documentation

## 2020-04-11 DIAGNOSIS — Z1231 Encounter for screening mammogram for malignant neoplasm of breast: Secondary | ICD-10-CM

## 2020-04-11 LAB — HEMOCCULT GUIAC POC 1CARD (OFFICE): Fecal Occult Blood, POC: NEGATIVE

## 2020-04-11 NOTE — Progress Notes (Signed)
Patient ID: Flavia Shipper, female   DOB: 04/09/1976, 44 y.o.   MRN: [id number] History of Present Illness:  Reanne is a 44 year old white female,married, [id number] in for a well woman gyn exam and pap. PCP is Dr Gerarda Fraction.  Current Medications, Allergies, Past Medical History, Past Surgical History, Family History and Social History were reviewed in [facility] record.     Review of Systems:  Patient denies any headaches, hearing loss, fatigue, blurred vision, shortness of breath, chest pain, abdominal pain, problems with bowel movements, urination, or intercourse. No joint pain or mood swings.   Physical Exam:BP 117/74 (BP Location: Left Arm, Patient Position: Sitting, Cuff Size: Normal)   Pulse 80   Ht 5' 5"  (1.651 m)   Wt 141 lb (64 kg)   LMP 03/30/2020 (Exact Date)   BMI 23.46 kg/m  General:  Well developed, well nourished, no acute distress Skin:  Warm and dry Neck:  Midline trachea, normal thyroid, good ROM, no lymphadenopathy Lungs; Clear to auscultation bilaterally Breast:  No dominant palpable mass, retraction, or nipple discharge Cardiovascular: Regular rate and rhythm Abdomen:  Soft, non tender, no hepatosplenomegaly Pelvic:  External genitalia is normal in appearance, no lesions.  The vagina is normal in appearance. Urethra has no lesions or masses. The cervix is bulbous.Pap with high risk HPV genotyping performed.  Uterus is felt to be normal size, shape, and contour.  No adnexal masses or tenderness noted.Bladder is non tender, no masses felt. Rectal: Good sphincter tone, no polyps, or hemorrhoids felt.  Hemoccult negative. Extremities/musculoskeletal:  No swelling or varicosities noted, no clubbing or cyanosis Psych:  No mood changes, alert and cooperative,seems happy AA is 0 Fall risk is low PHQ 9 score is 0  [facility] - 04/11/20 1354      Pregnancy Intention Screening   Does the patient want to become pregnant in the next year? N/A    Does the  patient's partner want to become pregnant in the next year? N/A    Would the patient like to discuss contraceptive options today? N/A      Contraception Wrap Up   Current Method Vasectomy    End Method Vasectomy    Contraception Counseling Provided No         Examination chaperoned by Levy Pupa LPN   Impression and Plan: 1. Encounter for gynecological examination with Papanicolaou smear of cervix Pap sent Physical in 1 year Pap in 3 if normal Mammogram tomorrow Colonoscopy per GI  Had labs with GI She has not gotten COVID vaccine, wants to wait til it has been out longer   2. Encounter for screening fecal occult blood testing

## 2020-04-12 ENCOUNTER — Ambulatory Visit (HOSPITAL_COMMUNITY)
Admission: RE | Admit: 2020-04-12 | Discharge: 2020-04-12 | Disposition: A | Discharge status: Home / Self Care | Payer: BC Managed Care – PPO | Source: Ambulatory Visit | Attending: Adult Health | Admitting: Adult Health

## 2020-04-12 ENCOUNTER — Other Ambulatory Visit: Payer: Self-pay

## 2020-04-12 DIAGNOSIS — Z1231 Encounter for screening mammogram for malignant neoplasm of breast: Secondary | ICD-10-CM | POA: Diagnosis not present

## 2020-04-13 LAB — CYTOLOGY - PAP
Comment: NEGATIVE
Diagnosis: NEGATIVE
High risk HPV: NEGATIVE

## 2020-04-18 ENCOUNTER — Other Ambulatory Visit (HOSPITAL_COMMUNITY): Payer: Self-pay | Admitting: Adult Health

## 2020-04-18 DIAGNOSIS — R928 Other abnormal and inconclusive findings on diagnostic imaging of breast: Secondary | ICD-10-CM

## 2020-04-25 ENCOUNTER — Other Ambulatory Visit: Payer: Self-pay

## 2020-04-25 ENCOUNTER — Ambulatory Visit (HOSPITAL_COMMUNITY)
Admission: RE | Admit: 2020-04-25 | Discharge: 2020-04-25 | Disposition: A | Discharge status: Home / Self Care | Payer: BC Managed Care – PPO | Source: Ambulatory Visit | Attending: Adult Health | Admitting: Adult Health

## 2020-04-25 DIAGNOSIS — R928 Other abnormal and inconclusive findings on diagnostic imaging of breast: Secondary | ICD-10-CM | POA: Insufficient documentation

## 2020-05-02 ENCOUNTER — Other Ambulatory Visit (HOSPITAL_COMMUNITY): Payer: BC Managed Care – PPO

## 2020-05-02 ENCOUNTER — Encounter (HOSPITAL_COMMUNITY): Payer: BC Managed Care – PPO

## 2020-06-06 ENCOUNTER — Encounter (INDEPENDENT_AMBULATORY_CARE_PROVIDER_SITE_OTHER): Payer: Self-pay | Admitting: Internal Medicine

## 2020-06-06 ENCOUNTER — Ambulatory Visit (INDEPENDENT_AMBULATORY_CARE_PROVIDER_SITE_OTHER): Payer: BC Managed Care – PPO | Admitting: Internal Medicine

## 2020-06-06 ENCOUNTER — Other Ambulatory Visit: Payer: Self-pay

## 2020-06-06 VITALS — BP 116/76 | HR 83 | Temp 98.1°F | Ht 65.0 in | Wt 141.8 lb

## 2020-06-06 DIAGNOSIS — K5080 Crohn's disease of both small and large intestine without complications: Secondary | ICD-10-CM | POA: Diagnosis not present

## 2020-06-06 NOTE — Progress Notes (Signed)
Presenting complaint;  Follow-up for Crohn's disease.  Database and subjective:  Patient is 44 year old Caucasian female with 24-year history of ileal and rectal Crohn's disease with mild course who is here for scheduled visit.  She was last seen by me in March 2021.  Surveillance colonoscopy was in February 2017 revealing mild rectal disease for which she was treated with mesalamine suppositories. She remains on oral mesalamine.  She has been on Humira/adalimumab primarily for rheumatoid arthritis and she is under care of Dr. Gavin Pound of rheumatology.  Patient says she is doing well.  She denies abdominal pain melena or rectal bleeding.  She is prone to constipation.  She is using polyethylene glycol and her bowels move daily.  She feels RA is in remission.  She just had blood work at rheumatology clinic yesterday.  Current Medications: Outpatient Encounter Medications as of 06/06/2020  Medication Sig  . Adalimumab (HUMIRA PEN) 40 MG/0.8ML PNKT Inject into the skin every 14 (fourteen) days.   Marland Kitchen b complex vitamins capsule Take 1 capsule by mouth daily.  . Calcium-Phosphorus-Vitamin D (CALCIUM GUMMIES PO) Take by mouth daily.   . cyclobenzaprine (FLEXERIL) 10 MG tablet Take 1 tablet (10 mg total) by mouth every 8 (eight) hours as needed for muscle spasms.  Marland Kitchen FIBER SELECT GUMMIES PO Take 1 tablet by mouth daily.  . mesalamine (PENTASA) 500 MG CR capsule TAKE 2 CAPSULES BY MOUTH 4 TIMES A DAY  . Multiple Vitamins-Minerals (MULTI ADULT GUMMIES PO) Take by mouth daily.  . Omega-3 Fatty Acids (FISH OIL) 1000 MG CAPS Take 1,000 mg by mouth daily.  . polyethylene glycol (MIRALAX / GLYCOLAX) 17 g packet Take 17 g by mouth daily.  . Probiotic Product (PROBIOTIC PO) Take by mouth daily.   No facility-administered encounter medications on file as of 06/06/2020.     Objective: Blood pressure 116/76, pulse 83, temperature 98.1 F (36.7 C), temperature source Oral, height 5' 5"  (1.651 m),  weight 141 lb 12.8 oz (64.3 kg). Patient is alert and in no acute distress. Patient is wearing a mask. Conjunctiva is pink. Sclera is nonicteric Oropharyngeal mucosa is normal. No neck masses or thyromegaly noted. Cardiac exam with regular rhythm normal S1 and S2. No murmur or gallop noted. Lungs are clear to auscultation. Abdomen is symmetrical soft and nontender with organomegaly or masses. No LE edema or clubbing noted.  Labs/studies Results:   CBC Latest Ref Rng & Units 11/30/2019 03/13/2016 07/13/2015  WBC 3.8 - 10.8 Thousand/uL 7.4 9.0 8.9  Hemoglobin 11.7 - 15.5 g/dL 13.6 12.3 13.0  Hematocrit 35 - 45 % 41.6 37.7 39.1  Platelets 140 - 400 Thousand/uL 291 285 285    CMP Latest Ref Rng & Units 11/30/2019 02/24/2013 05/03/2011  Glucose 65 - 99 mg/dL 81 82 93  BUN 7 - 25 mg/dL 12 13 8   Creatinine 0.50 - 1.10 mg/dL 0.71 0.65 0.63  Sodium 135 - 146 mmol/L 139 137 135  Potassium 3.5 - 5.3 mmol/L 4.0 4.4 3.7  Chloride 98 - 110 mmol/L 103 102 98  CO2 20 - 32 mmol/L 30 29 29   Calcium 8.6 - 10.2 mg/dL 9.4 9.5 9.9  Total Protein 6.1 - 8.1 g/dL 6.8 7.0 -  Total Bilirubin 0.2 - 1.2 mg/dL 0.3 0.3 -  Alkaline Phos 39 - 117 U/L - 73 -  AST 10 - 30 U/L 13 15 -  ALT 6 - 29 U/L 11 12 -    Hepatic Function Latest Ref Rng & Units 11/30/2019 02/24/2013  Total Protein 6.1 - 8.1 g/dL 6.8 7.0  Albumin 3.5 - 5.2 g/dL - 4.4  AST 10 - 30 U/L 13 15  ALT 6 - 29 U/L 11 12  Alk Phosphatase 39 - 117 U/L - 73  Total Bilirubin 0.2 - 1.2 mg/dL 0.3 0.3    Lab Results  Component Value Date   CRP 0.3 11/30/2019      Assessment:  #1.  Ileal and rectal Crohn's disease with mild clinical course.  Disease duration 24 years.  She she had mild disease activity in the rectum on colonoscopy of February 2017 treated with topical mesalamine.  She remains on oral mesalamine as well.  She appears to be in remission.  She is on Humira/adalimumab primarily for rheumatoid arthritis but it is clearly helping with  inflammatory bowel disease as well.   Plan:  Request copy of recent blood work from rheumatology clinic. Continue Pentasa at current dose. Office visit in 1 year at which time we will schedule her for surveillance colonoscopy.

## 2020-06-06 NOTE — Patient Instructions (Signed)
Will request copy of recent blood work from Dr. Melissa Noon office. Notify if he experienced diarrhea or rectal bleeding.

## 2020-08-08 ENCOUNTER — Encounter (INDEPENDENT_AMBULATORY_CARE_PROVIDER_SITE_OTHER): Payer: Self-pay

## 2020-09-05 ENCOUNTER — Other Ambulatory Visit: Payer: Self-pay

## 2020-09-05 ENCOUNTER — Ambulatory Visit (HOSPITAL_COMMUNITY)
Admission: RE | Admit: 2020-09-05 | Discharge: 2020-09-05 | Disposition: A | Discharge status: Home / Self Care | Payer: BC Managed Care – PPO | Source: Ambulatory Visit | Attending: Internal Medicine | Admitting: Internal Medicine

## 2020-09-05 ENCOUNTER — Other Ambulatory Visit (HOSPITAL_COMMUNITY): Payer: Self-pay | Admitting: Internal Medicine

## 2020-09-05 DIAGNOSIS — R0600 Dyspnea, unspecified: Secondary | ICD-10-CM

## 2020-09-19 ENCOUNTER — Encounter (INDEPENDENT_AMBULATORY_CARE_PROVIDER_SITE_OTHER): Payer: Self-pay | Admitting: Internal Medicine

## 2020-09-19 ENCOUNTER — Ambulatory Visit (INDEPENDENT_AMBULATORY_CARE_PROVIDER_SITE_OTHER): Payer: BC Managed Care – PPO | Admitting: Internal Medicine

## 2020-09-19 ENCOUNTER — Other Ambulatory Visit: Payer: Self-pay

## 2020-09-19 DIAGNOSIS — R0789 Other chest pain: Secondary | ICD-10-CM | POA: Diagnosis not present

## 2020-09-19 DIAGNOSIS — A048 Other specified bacterial intestinal infections: Secondary | ICD-10-CM | POA: Diagnosis not present

## 2020-09-19 MED ORDER — PANTOPRAZOLE SODIUM 40 MG PO TBEC
40.0000 mg | DELAYED_RELEASE_TABLET | Freq: Two times a day (BID) | ORAL | 1 refills | Status: DC
Start: 2020-09-19 — End: 2021-10-09

## 2020-09-19 NOTE — Patient Instructions (Signed)
Take pantoprazole 40 mg twice daily while you are on antibiotic.  When you finish antibiotics go back to taking once a day.  Take it 30 minutes before breakfast daily Physician will call with results of abdominal ultrasound when completed.

## 2020-09-19 NOTE — Progress Notes (Signed)
Presenting complaint;  Chest and epigastric pain.  Database and subjective:  Patient is 45 year old Caucasian female who has history of ileal and rectal Crohn's disease of 25 years duration who was last seen on 06/06/2020 and appeared to be in remission.  She is scheduled for surveillance colonoscopy later this year or next.  She presents with a new complaint.  She says for the last 6 months she has noted tightness in her chest below her breasts.  She did not see much change in her symptom after changing her bras.  She also has experienced intermittent cough for 1 month.  She also experienced shortness of breath..  On few occasions she has had upper abdominal cramping pain.  She says she had a big meal at a restaurant last month and within 15 minutes she felt very bad and experience vague pain in epigastrium and chest which resolved spontaneously within few minutes.  She has not experience nausea or vomiting.  She has had regurgitation couple of times every week. She was seen at [facility] early this month.  Chest film did not reveal any active disease.  Showed calcified granuloma right middle lobe.  Her CBC and LFTs were normal.  Sed rate was 2.  Her H. pylori serology was positive and she was called prescription for clarithromycin and metronidazole.  She has been on pantoprazole for about 12 or 13 days.  She does not feel significant symptomatic improvement.  She is not having heartburn. She says back in January 2022 she had fever of 101 F.  She was worried that she may have Covid.  She did rapid home test on 2 different occasions and was negative. She was also started on fluconazole for fungal toenail infection.  She is taking 150 mg every week for 4 weeks. She has gained 7 pounds since her last visit of November 2021.  She denies diarrhea melena or rectal bleeding. She remains on Humira and not having any side effects. She does not take OTC NSAIDs. She is watching her diet.  She has 1 glass  of decaffeinated diluted tea. She does not smoke cigarettes or drink alcohol. Family history is negative for gallbladder disease on mother side.  Current Medications: Outpatient Encounter Medications as of 09/19/2020  Medication Sig  . Adalimumab 40 MG/0.8ML PNKT Inject into the skin every 14 (fourteen) days.   Marland Kitchen b complex vitamins capsule Take 1 capsule by mouth daily.  . Calcium-Phosphorus-Vitamin D (CALCIUM GUMMIES PO) Take by mouth daily.   . cyclobenzaprine (FLEXERIL) 10 MG tablet Take 1 tablet (10 mg total) by mouth every 8 (eight) hours as needed for muscle spasms.  Marland Kitchen FIBER SELECT GUMMIES PO Take 1 tablet by mouth daily.  . fluconazole (DIFLUCAN) 150 MG tablet Take 150 mg by mouth. Patient is taking 1 by mouth weekly on Sundays.  . mesalamine (PENTASA) 500 MG CR capsule TAKE 2 CAPSULES BY MOUTH 4 TIMES A DAY  . Multiple Vitamins-Minerals (MULTI ADULT GUMMIES PO) Take by mouth daily.  . Omega-3 Fatty Acids (FISH OIL) 1000 MG CAPS Take 1,000 mg by mouth daily.  . polyethylene glycol (MIRALAX / GLYCOLAX) 17 g packet Take 17 g by mouth daily.  . Probiotic Product (PROBIOTIC PO) Take by mouth daily.  . [DISCONTINUED] pantoprazole (PROTONIX) 40 MG tablet Take 40 mg by mouth daily.  . pantoprazole (PROTONIX) 40 MG tablet Take 1 tablet (40 mg total) by mouth 2 (two) times daily before a meal.  . [DISCONTINUED] hyoscyamine (ANASPAZ) 0.125 MG TBDP  disintergrating tablet Place 0.125 mg under the tongue 4 (four) times daily as needed. (Patient not taking: Reported on 09/19/2020)   No facility-administered encounter medications on file as of 09/19/2020.    Past Medical History:  Diagnosis Date  . Anxiety 03/04/2013  . Crohn's disease of ileum and colon ([place])   . Dermoid cyst of eyebrow 10/2015   left  . 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 Headache, menstrual migraine        Rheumatoid arthritis  Past Surgical History:  Procedure Laterality Date  . COLONOSCOPY  06/19/2005  .  COLONOSCOPY N/A 08/10/2015   Procedure: COLONOSCOPY;  Surgeon: Rogene Houston, MD;  [facility];  Service: Endoscopy;  Laterality: N/A;  1:25  . EYE SURGERY     benign tumor  . MASS EXCISION Left 10/27/2015   Procedure: EXCISION MASS LEFT BROW ( EXCISION OF SUBMUSCULAR MASS LEFT BROW 2.5CM);  Surgeon: Irene Limbo, MD;  Location: [facility];  Service: Plastics;  Laterality: Left;    Objective: Blood pressure 112/75, pulse 76, temperature 98.1 F (36.7 C), temperature source Oral, height 5' 5"  (1.651 m), weight 148 lb 3.2 oz (67.2 kg), last menstrual period 08/28/2020. Patient is alert and in no acute distress. Patient is wearing a mask. Conjunctiva is pink. Sclera is nonicteric Oropharyngeal mucosa is normal. No neck masses or thyromegaly noted. Cardiac exam with regular rhythm normal S1 and S2. No murmur or gallop noted. Lungs are clear to auscultation. Abdomen is symmetrical.  Bowel sounds are normal.  On palpation abdomen is soft and nontender without organomegaly or masses. No LE edema or clubbing noted.  Labs/studies Results: Lab data from 09/08/2020 WBC 6.0 H&H 12.9 and 38.4 Platelet count 259K Differential within normal limits.  Glucose 92, BUN 12 and creatinine 0.70 Electrolytes within normal limits. Serum calcium 9.5 Bilirubin 0.3 AP 74 AST 15 and ALT 13 Total protein 7.2 with albumin of 4.5.  H. pylori serology. H. pylori IgM antibody positive H. pylori IgG and IgA antibodies negative.   Assessment:  #1.  Atypical chest pain.  She also experience cough and shortness of breath or chest x-ray negative for acute abnormalities.  She is suspected to have symptoms due to GERD but she has not responded to single dose of pantoprazole which she has been for about 12 days.  On work-up she is found to have H. pylori infection which may or may not be source of her symptoms.  Also need to rule out gallbladder disease.  #2.  Positive serology for H.  pylori infection.  IgM antibodies positive may suggest recent infection.  She will start clarithromycin and metronidazole as recommended by Dr. Riley Kill.  She needs to double up on pantoprazole for the ration of antibiotic therapy.  #3.  Ileal and rectal Crohn's disease.  Last colonoscopy was in February 2017.  She is due for surveillance exam sometime this year.  Plan:  Increase pantoprazole to 40 mg p.o. twice daily. Proceed with antibiotic therapy for H. pylori infection(clarithromycin and metronidazole). Abdominal ultrasound. If abdominal ultrasound is negative and she does not respond to therapy for H. pylori infection would consider esophagogastroduodenoscopy. Office visit date to be determined after ultrasound completed.

## 2020-09-19 NOTE — Progress Notes (Incomplete)
Presenting complaint;  Chest and epigastric pain.  Database and subjective:  Patient is 45 year old Caucasian female who has history of ileal and rectal Crohn's disease of 25 years duration who was last seen on 06/06/2020 and appeared to be in remission.  She is scheduled for surveillance colonoscopy later this year or next.  She presents with a new complaint.  She says for the last 6 months she has noted tightness in her chest below her breasts.  She did not see much change in her symptom after changing her bras.  She also has experienced intermittent cough for 1 month.  She also experienced shortness of breath..  On few occasions she has had upper abdominal cramping pain.  She says she had a big meal at a restaurant last month and within 15 minutes she felt very bad and experience vague pain in epigastrium and chest which resolved spontaneously within few minutes.  She has not experience nausea or vomiting.  She has had regurgitation couple of times every week. She was seen at [place] early this month.  Chest film did not reveal any active disease.  Showed calcified granuloma right middle lobe.  Her CBC and LFTs were normal.  Sed rate was 2.  Her H. pylori serology was positive and she was called prescription for clarithromycin and metronidazole.  She has been on pantoprazole for about 12 or 13 days.  She does not feel significant symptomatic improvement.  She is not having heartburn. She says back in January 2022 she had fever of 101 F.  She was worried that she may have Covid.  She did rapid home test on 2 different occasions and was negative. She was also started on fluconazole for fungal toenail infection.  She is taking 150 mg every week for 4 weeks. She has gained 7 pounds since her last visit of November 2021.  She denies diarrhea melena or rectal bleeding. She remains on Humira and not having any side effects. She does not take OTC NSAIDs. She is watching her diet.  She has 1 glass  of decaffeinated diluted tea. She does not smoke cigarettes or drink alcohol. Family history is negative for gallbladder disease on mother side.  Current Medications: Outpatient Encounter Medications as of 09/19/2020  Medication Sig  . Adalimumab 40 MG/0.8ML PNKT Inject into the skin every 14 (fourteen) days.   Marland Kitchen b complex vitamins capsule Take 1 capsule by mouth daily.  . Calcium-Phosphorus-Vitamin D (CALCIUM GUMMIES PO) Take by mouth daily.   . cyclobenzaprine (FLEXERIL) 10 MG tablet Take 1 tablet (10 mg total) by mouth every 8 (eight) hours as needed for muscle spasms.  Marland Kitchen FIBER SELECT GUMMIES PO Take 1 tablet by mouth daily.  . fluconazole (DIFLUCAN) 150 MG tablet Take 150 mg by mouth. Patient is taking 1 by mouth weekly on Sundays.  . mesalamine (PENTASA) 500 MG CR capsule TAKE 2 CAPSULES BY MOUTH 4 TIMES A DAY  . Multiple Vitamins-Minerals (MULTI ADULT GUMMIES PO) Take by mouth daily.  . Omega-3 Fatty Acids (FISH OIL) 1000 MG CAPS Take 1,000 mg by mouth daily.  . polyethylene glycol (MIRALAX / GLYCOLAX) 17 g packet Take 17 g by mouth daily.  . Probiotic Product (PROBIOTIC PO) Take by mouth daily.  . [DISCONTINUED] pantoprazole (PROTONIX) 40 MG tablet Take 40 mg by mouth daily.  . pantoprazole (PROTONIX) 40 MG tablet Take 1 tablet (40 mg total) by mouth 2 (two) times daily before a meal.  . [DISCONTINUED] hyoscyamine (ANASPAZ) 0.125 MG TBDP  disintergrating tablet Place 0.125 mg under the tongue 4 (four) times daily as needed. (Patient not taking: Reported on 09/19/2020)   No facility-administered encounter medications on file as of 09/19/2020.    Past Medical History:  Diagnosis Date  . Anxiety 03/04/2013  . Crohn's disease of ileum and colon ([place])   . Dermoid cyst of eyebrow 10/2015   left  . 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 Headache, menstrual migraine        Rheumatoid arthritis  Past Surgical History:  Procedure Laterality Date  . COLONOSCOPY  06/19/2005  .  COLONOSCOPY N/A 08/10/2015   Procedure: COLONOSCOPY;  Surgeon: Rogene Houston, MD;  [facility];  Service: Endoscopy;  Laterality: N/A;  1:25  . EYE SURGERY     benign tumor  . MASS EXCISION Left 10/27/2015   Procedure: EXCISION MASS LEFT BROW ( EXCISION OF SUBMUSCULAR MASS LEFT BROW 2.5CM);  Surgeon: Irene Limbo, MD;  Location: [place];  Service: Plastics;  Laterality: Left;    Objective: Blood pressure 112/75, pulse 76, temperature 98.1 F (36.7 C), temperature source Oral, height 5' 5"  (1.651 m), weight 148 lb 3.2 oz (67.2 kg), last menstrual period 08/28/2020. Patient is alert and in no acute distress. Patient is wearing a mask. Conjunctiva is pink. Sclera is nonicteric Oropharyngeal mucosa is normal. No neck masses or thyromegaly noted. Cardiac exam with regular rhythm normal S1 and S2. No murmur or gallop noted. Lungs are clear to auscultation. Abdomen is symmetrical.  Bowel sounds are normal.  On palpation abdomen is soft and nontender without organomegaly or masses. No LE edema or clubbing noted.  Labs/studies Results: Lab data from 09/08/2020 WBC 6.0 H&H 12.9 and 38.4 Platelet count 259K Differential within normal limits.  Glucose 92, BUN 12 and creatinine 0.70 Electrolytes within normal limits. Serum calcium 9.5 Bilirubin 0.3 AP 74 AST 15 and ALT 13 Total protein 7.2 with albumin of 4.5.  H. pylori serology. H. pylori IgM antibody positive H. pylori IgG and IgA antibodies negative.   Assessment:  #1.  Atypical chest pain.  She also experience cough and shortness of breath or chest x-ray negative for acute abnormalities.  She is suspected to have symptoms due to GERD but she has not responded to single dose of pantoprazole which she has been for about 12 days.  On work-up she is found to have H. pylori infection which may or may not be source of her symptoms.  Also need to rule out gallbladder disease.  #2.  Positive serology for H.  pylori infection.  IgM antibodies positive may suggest recent infection.  She will start clarithromycin and metronidazole as recommended by Dr. Riley Kill.  She needs to double up on pantoprazole for the ration of antibiotic therapy.  #3.  Ileal and rectal Crohn's disease.  Last colonoscopy was #4.   Plan:  ***

## 2020-09-25 ENCOUNTER — Other Ambulatory Visit (INDEPENDENT_AMBULATORY_CARE_PROVIDER_SITE_OTHER): Payer: Self-pay

## 2020-09-25 DIAGNOSIS — R0789 Other chest pain: Secondary | ICD-10-CM

## 2020-09-26 ENCOUNTER — Ambulatory Visit (HOSPITAL_COMMUNITY): Payer: BC Managed Care – PPO

## 2020-10-05 ENCOUNTER — Telehealth (INDEPENDENT_AMBULATORY_CARE_PROVIDER_SITE_OTHER): Payer: Self-pay | Admitting: Internal Medicine

## 2020-10-05 ENCOUNTER — Encounter (INDEPENDENT_AMBULATORY_CARE_PROVIDER_SITE_OTHER): Payer: Self-pay

## 2020-10-05 NOTE — Telephone Encounter (Signed)
[place] results reviewed with patient. Normal study. Patient feels better. For OV in one year.

## 2020-10-10 ENCOUNTER — Encounter (INDEPENDENT_AMBULATORY_CARE_PROVIDER_SITE_OTHER): Payer: Self-pay

## 2021-04-13 ENCOUNTER — Other Ambulatory Visit (HOSPITAL_COMMUNITY): Payer: Self-pay | Admitting: Adult Health

## 2021-04-13 DIAGNOSIS — Z1231 Encounter for screening mammogram for malignant neoplasm of breast: Secondary | ICD-10-CM

## 2021-04-18 ENCOUNTER — Encounter (HOSPITAL_COMMUNITY): Payer: Self-pay

## 2021-04-18 ENCOUNTER — Ambulatory Visit (HOSPITAL_COMMUNITY)
Admission: RE | Admit: 2021-04-18 | Discharge: 2021-04-18 | Disposition: A | Discharge status: Home / Self Care | Payer: BC Managed Care – PPO | Source: Ambulatory Visit | Attending: Adult Health | Admitting: Adult Health

## 2021-04-18 ENCOUNTER — Other Ambulatory Visit: Payer: Self-pay

## 2021-04-18 DIAGNOSIS — Z1231 Encounter for screening mammogram for malignant neoplasm of breast: Secondary | ICD-10-CM | POA: Insufficient documentation

## 2021-04-24 ENCOUNTER — Other Ambulatory Visit (HOSPITAL_COMMUNITY): Payer: Self-pay | Admitting: Adult Health

## 2021-04-24 DIAGNOSIS — R928 Other abnormal and inconclusive findings on diagnostic imaging of breast: Secondary | ICD-10-CM

## 2021-04-28 ENCOUNTER — Encounter (HOSPITAL_COMMUNITY): Payer: Self-pay | Admitting: Emergency Medicine

## 2021-04-28 ENCOUNTER — Other Ambulatory Visit: Payer: Self-pay

## 2021-04-28 ENCOUNTER — Emergency Department (HOSPITAL_COMMUNITY)
Admission: EM | Admit: 2021-04-28 | Discharge: 2021-04-28 | Disposition: A | Discharge status: Home / Self Care | Payer: BC Managed Care – PPO | Attending: Emergency Medicine | Admitting: Emergency Medicine

## 2021-04-28 DIAGNOSIS — Z8616 Personal history of COVID-19: Secondary | ICD-10-CM | POA: Diagnosis not present

## 2021-04-28 DIAGNOSIS — U071 COVID-19: Secondary | ICD-10-CM | POA: Insufficient documentation

## 2021-04-28 DIAGNOSIS — Z28310 Unvaccinated for covid-19: Secondary | ICD-10-CM | POA: Diagnosis not present

## 2021-04-28 DIAGNOSIS — R5383 Other fatigue: Secondary | ICD-10-CM | POA: Diagnosis present

## 2021-04-28 HISTORY — DX: Unspecified osteoarthritis, unspecified site: M19.90

## 2021-04-28 LAB — RESP PANEL BY RT-PCR (FLU A&B, COVID) ARPGX2
Influenza A by PCR: NEGATIVE
Influenza B by PCR: NEGATIVE
SARS Coronavirus 2 by RT PCR: POSITIVE — AB

## 2021-04-28 MED ORDER — SODIUM CHLORIDE 0.9 % IV BOLUS
1000.0000 mL | Freq: Once | INTRAVENOUS | Status: AC
  Administered 2021-04-28: 1000 mL via INTRAVENOUS

## 2021-04-28 MED ORDER — NIRMATRELVIR/RITONAVIR (PAXLOVID)TABLET: 3.0000 | ORAL_TABLET | Freq: Two times a day (BID) | ORAL | 0 refills | Status: AC

## 2021-04-28 MED ORDER — ONDANSETRON HCL 4 MG/2ML IJ SOLN
4.0000 mg | Freq: Once | INTRAMUSCULAR | Status: AC
  Administered 2021-04-28: 4 mg via INTRAVENOUS
  Filled 2021-04-28: qty 2

## 2021-04-28 NOTE — ED Triage Notes (Signed)
Weakness, nausea x 3 days. No known covid exposure,

## 2021-04-28 NOTE — ED Provider Notes (Signed)
[facility] EMERGENCY DEPARTMENT Provider Note   CSN: [id number] Arrival date & time: 04/28/21  1022     History Chief Complaint  Patient presents with   Weakness    Tamara Martin is a 45 y.o. female.   Weakness Associated symptoms: cough, headaches and myalgias   Associated symptoms: no abdominal pain, no arthralgias, no chest pain, no diarrhea, no dizziness, no dysuria, no fever, no nausea, no shortness of breath and no vomiting       Tamara Martin is a 45 y.o. female with past medical history of Crohn's, arthritis, who presents to the Emergency Department for evaluation of generalized weakness, fatigue, nausea and occasional sinus pressure and congestion.  Symptoms have been present for 3 days, gradually worsening.  She felt as though she had a sinus infection at onset.  Describing facial pressure, symptoms gradually worsening with generalized weakness this morning.  Unknown fever.  She denies chest pain, shortness of breath, dysuria, abdominal pain, vomiting or diarrhea.  Concerned that she may have COVID.  States she had COVID in 2020.  Has not been vaccinated for COVID-19.    Past Medical History:  Diagnosis Date   Anxiety 03/04/2013   Arthritis    Crohn's disease of colon ([place])    Dermoid cyst of eyebrow 10/2015   left   Fibroadenoma of right breast 02/27/2017   Headache, menstrual migraine     Patient Active Problem List   Diagnosis Date Noted   Bacterial infection due to H. pylori 09/19/2020   Atypical chest pain 09/19/2020   Encounter for screening fecal occult blood testing 04/11/2020   Fibroadenoma of right breast 02/27/2017   Encounter for gynecological examination with Papanicolaou smear of cervix 03/14/2016   Rectocele 03/14/2016   Anxiety 03/04/2013   Fatigue 02/24/2013   Irregular menses 02/24/2013   History of migraine headaches 10/14/2012   Crohn's disease ([place]) 08/12/2011   Arthritis of knee, left 08/12/2011    Past Surgical History:   Procedure Laterality Date   COLONOSCOPY  06/19/2005   COLONOSCOPY N/A 08/10/2015   Procedure: COLONOSCOPY;  Surgeon: Rogene Houston, MD;  [facility];  Service: Endoscopy;  Laterality: N/A;  1:25   EYE SURGERY     benign tumor   MASS EXCISION Left 10/27/2015   Procedure: EXCISION MASS LEFT BROW ( EXCISION OF SUBMUSCULAR MASS LEFT BROW 2.5CM);  Surgeon: Irene Limbo, MD;  Location: [place];  Service: Plastics;  Laterality: Left;     OB History     Gravida  4   Para  3   Term      Preterm      AB  1   Living  3      SAB  1   IAB      Ectopic      Multiple      Live Births  3           Family History  Problem Relation Age of Onset   Atrial fibrillation Mother    Cirrhosis Father    Cancer Father        stomach and bone   Cancer Maternal Aunt        lung   Cancer Maternal Uncle        bones   Diabetes Maternal Grandmother    Hypertension Maternal Grandmother    Diabetes Maternal Grandfather     Social History   Tobacco Use   Smoking status: Never  Smokeless tobacco: Never  Vaping Use   Vaping Use: Never used  Substance Use Topics   Alcohol use: No    Alcohol/week: 0.0 standard drinks   Drug use: No    Home Medications Prior to Admission medications   Medication Sig Start Date End Date Taking? Authorizing Provider  Adalimumab 40 MG/0.8ML PNKT Inject into the skin every 14 (fourteen) days.     [provider]  b complex vitamins capsule Take 1 capsule by mouth daily.    [provider]  Calcium-Phosphorus-Vitamin D (CALCIUM GUMMIES PO) Take by mouth daily.     [provider]  cyclobenzaprine (FLEXERIL) 10 MG tablet Take 1 tablet (10 mg total) by mouth every 8 (eight) hours as needed for muscle spasms. 01/21/19   Estill Dooms, NP  FIBER SELECT GUMMIES PO Take 1 tablet by mouth daily.    [provider]  fluconazole (DIFLUCAN) 150 MG tablet Take 150 mg by mouth. Patient  is taking 1 by mouth weekly on Sundays. 09/13/20   [provider]  mesalamine (PENTASA) 500 MG CR capsule TAKE 2 CAPSULES BY MOUTH 4 TIMES A DAY 03/27/20   Laurine Blazer B, PA-C  Multiple Vitamins-Minerals (MULTI ADULT GUMMIES PO) Take by mouth daily.    [provider]  Omega-3 Fatty Acids (FISH OIL) 1000 MG CAPS Take 1,000 mg by mouth daily.    [provider]  pantoprazole (PROTONIX) 40 MG tablet Take 1 tablet (40 mg total) by mouth 2 (two) times daily before a meal. 09/19/20   Rehman, Mechele Dawley, MD  polyethylene glycol (MIRALAX / GLYCOLAX) 17 g packet Take 17 g by mouth daily.    [provider]  Probiotic Product (PROBIOTIC PO) Take by mouth daily.    [provider]    Allergies    Penicillins  Review of Systems   Review of Systems  Constitutional:  Positive for fatigue. Negative for chills and fever.  HENT:  Positive for congestion, sinus pressure and sinus pain. Negative for sore throat and trouble swallowing.   Eyes:  Negative for visual disturbance.  Respiratory:  Positive for cough. Negative for shortness of breath and wheezing.   Cardiovascular:  Negative for chest pain and palpitations.  Gastrointestinal:  Negative for abdominal pain, diarrhea, nausea and vomiting.  Genitourinary:  Negative for difficulty urinating, dysuria, flank pain and hematuria.  Musculoskeletal:  Positive for myalgias. Negative for arthralgias, back pain, neck pain and neck stiffness.  Skin:  Negative for rash.  Neurological:  Positive for weakness and headaches. Negative for dizziness and numbness.  Hematological:  Does not bruise/bleed easily.   Physical Exam Updated Vital Signs BP 106/66   Pulse 71   Temp 98.2 F (36.8 C) (Oral)   Resp 18   Ht 5' 5"  (1.651 m)   Wt 68 kg   LMP 04/27/2021 (Exact Date)   SpO2 100%   BMI 24.96 kg/m   Physical Exam Vitals and nursing note reviewed.  Constitutional:      General: She is not in acute distress.     Appearance: Normal appearance. She is not ill-appearing or toxic-appearing.  HENT:     Nose: No rhinorrhea.     Mouth/Throat:     Mouth: Mucous membranes are moist.     Pharynx: Oropharynx is clear. Posterior oropharyngeal erythema present. No oropharyngeal exudate.     Comments: Mild erythema of the oropharynx.  No edema.  No tonsillar exudate.  Uvula is midline nonedematous. Eyes:  Conjunctiva/sclera: Conjunctivae normal.  Cardiovascular:     Rate and Rhythm: Normal rate and regular rhythm.     Pulses: Normal pulses.  Pulmonary:     Effort: Pulmonary effort is normal. No respiratory distress.     Breath sounds: Normal breath sounds. No wheezing or rales.  Abdominal:     Palpations: Abdomen is soft.     Tenderness: There is no abdominal tenderness.  Musculoskeletal:        General: Normal range of motion.     Cervical back: Normal range of motion.  Lymphadenopathy:     Cervical: No cervical adenopathy.  Skin:    General: Skin is warm.     Capillary Refill: Capillary refill takes less than 2 seconds.     Findings: No rash.  Neurological:     General: No focal deficit present.     Mental Status: She is alert.     Sensory: No sensory deficit.     Motor: No weakness.    ED Results / Procedures / Treatments   Labs (all labs ordered are listed, but only abnormal results are displayed) Labs Reviewed  RESP PANEL BY RT-PCR (FLU A&B, COVID) ARPGX2 - Abnormal; Notable for the following components:      Result Value   SARS Coronavirus 2 by RT PCR POSITIVE (*)    All other components within normal limits    EKG None  Radiology No results found.  Procedures Procedures   Medications Ordered in ED Medications  sodium chloride 0.9 % bolus 1,000 mL (1,000 mLs Intravenous New Bag/Given 04/28/21 1157)  ondansetron (ZOFRAN) injection 4 mg (4 mg Intravenous Given 04/28/21 1158)    ED Course  I have reviewed the triage vital signs and the nursing notes.  Pertinent labs &  imaging results that were available during my care of the patient were reviewed by me and considered in my medical decision making (see chart for details).    MDM Rules/Calculators/A&P                           Patient here with 3-day history of symptoms concerning for COVID-19.  Symptoms have been gradually worsening.  She denies any chest pain shortness of breath, vomiting or diarrhea.  Had [place] in 2020, not vaccinated.  On exam, patient well-appearing nontoxic.  Vital signs reassuring.  No hypoxia, tachycardia or tachypnea.  Lungs clear, doubt PNA.  PERC negative.  Doubt emergent process.  COVID test pending.  On recheck, patient resting comfortably.  COVID test positive.  She is immunocompromised with history of Crohn's and currently taking Humira.  Discussed treatment with Paxil COVID.  Patient is agreeable to plan.  Appropriate for discharge home, will follow-up closely with PCP.  Strict return precautions were discussed.   Final Clinical Impression(s) / ED Diagnoses Final diagnoses:  COVID    Rx / DC Orders ED Discharge Orders     None        Kem Parkinson, PA-C 04/28/21 1402    Lajean Saver, MD 04/28/21 [phone]

## 2021-04-28 NOTE — Discharge Instructions (Signed)
Your COVID test today is positive.  You have been prescribed a medication called Paxlovid.  This medicine will help you to feel better sooner.  Take as directed until it is finished.  You may take Tylenol every 4 hours if needed for body aches and/or fever.  You will need to isolate at home for 5 days.  On day 5, if you are feeling better and no longer have fever (without taking fever reducing medication such as Tylenol or ibuprofen) you may return to normal activity but will need to wear a mask around others for an additional 5 days.  Please follow-up with your primary care provider.  Return to the emergency department for any new or worsening symptoms.

## 2021-04-28 NOTE — ED Notes (Signed)
Date and time results received: 04/28/21   Test: Covid Critical Value: positive  Name of Provider Notified: Tammy Triplett  Orders Received? Or Actions Taken?: see orders

## 2021-04-28 NOTE — ED Notes (Signed)
ED provider at bedside.

## 2021-04-28 NOTE — ED Notes (Signed)
Dc instructions and scripts reviewed with pt and spouse. No questions or concerns at this time. Pt refused wheelchair. Ambulated with steady gait with spouse at bedside out of department

## 2021-05-02 ENCOUNTER — Ambulatory Visit (HOSPITAL_COMMUNITY): Payer: BC Managed Care – PPO

## 2021-05-02 ENCOUNTER — Encounter (HOSPITAL_COMMUNITY): Payer: BC Managed Care – PPO

## 2021-05-10 ENCOUNTER — Encounter (HOSPITAL_COMMUNITY): Payer: Self-pay

## 2021-05-10 ENCOUNTER — Other Ambulatory Visit: Payer: Self-pay

## 2021-05-10 ENCOUNTER — Ambulatory Visit (HOSPITAL_COMMUNITY)
Admission: RE | Admit: 2021-05-10 | Discharge: 2021-05-10 | Disposition: A | Discharge status: Home / Self Care | Payer: BC Managed Care – PPO | Source: Ambulatory Visit | Attending: Adult Health | Admitting: Adult Health

## 2021-05-10 DIAGNOSIS — R928 Other abnormal and inconclusive findings on diagnostic imaging of breast: Secondary | ICD-10-CM

## 2021-07-02 ENCOUNTER — Telehealth: Payer: BC Managed Care – PPO | Admitting: Family

## 2021-07-02 DIAGNOSIS — J209 Acute bronchitis, unspecified: Secondary | ICD-10-CM

## 2021-07-02 MED ORDER — BENZONATATE 100 MG PO CAPS: 100.0000 mg | ORAL_CAPSULE | Freq: Three times a day (TID) | ORAL | 0 refills | Status: DC | PRN

## 2021-07-02 MED ORDER — PREDNISONE 10 MG (21) PO TBPK: ORAL_TABLET | ORAL | 0 refills | Status: DC

## 2021-07-02 NOTE — Progress Notes (Signed)
We are sorry that you are not feeling well.  Here is how we plan to help!  Based on your presentation I believe you most likely have A cough due to a virus.  This is called viral bronchitis and is best treated by rest, plenty of fluids and control of the cough.  You may use Ibuprofen or Tylenol as directed to help your symptoms.     In addition you may use A non-prescription cough medication called Robitussin DAC. Take 2 teaspoons every 8 hours or Delsym: take 2 teaspoons every 12 hours., A non-prescription cough medication called Mucinex DM: take 2 tablets every 12 hours., and A prescription cough medication called Tessalon Perles 149m. You may take 1-2 capsules every 8 hours as needed for your cough.  Prednisone 10 mg daily for 6 days (see taper instructions below)  Directions for 6 day taper: Day 1: 2 tablets before breakfast, 1 after both lunch & dinner and 2 at bedtime Day 2: 1 tab before breakfast, 1 after both lunch & dinner and 2 at bedtime Day 3: 1 tab at each meal & 1 at bedtime Day 4: 1 tab at breakfast, 1 at lunch, 1 at bedtime Day 5: 1 tab at breakfast & 1 tab at bedtime Day 6: 1 tab at breakfast  From your responses in the eVisit questionnaire you describe inflammation in the upper respiratory tract which is causing a significant cough.  This is commonly called Bronchitis and has four common causes:   Allergies Viral Infections Acid Reflux Bacterial Infection Allergies, viruses and acid reflux are treated by controlling symptoms or eliminating the cause. An example might be a cough caused by taking certain blood pressure medications. You stop the cough by changing the medication. Another example might be a cough caused by acid reflux. Controlling the reflux helps control the cough.  USE OF BRONCHODILATOR ("RESCUE") INHALERS: There is a risk from using your bronchodilator too frequently.  The risk is that over-reliance on a medication which only relaxes the muscles surrounding  the breathing tubes can reduce the effectiveness of medications prescribed to reduce swelling and congestion of the tubes themselves.  Although you feel brief relief from the bronchodilator inhaler, your asthma may actually be worsening with the tubes becoming more swollen and filled with mucus.  This can delay other crucial treatments, such as oral steroid medications. If you need to use a bronchodilator inhaler daily, several times per day, you should discuss this with your provider.  There are probably better treatments that could be used to keep your asthma under control.     HOME CARE Only take medications as instructed by your medical team. Complete the entire course of an antibiotic. Drink plenty of fluids and get plenty of rest. Avoid close contacts especially the very young and the elderly Cover your mouth if you cough or cough into your sleeve. Always remember to wash your hands A steam or ultrasonic humidifier can help congestion.   GET HELP RIGHT AWAY IF: You develop worsening fever. You become short of breath You cough up blood. Your symptoms persist after you have completed your treatment plan MAKE SURE YOU  Understand these instructions. Will watch your condition. Will get help right away if you are not doing well or get worse.    Thank you for choosing an e-visit.  Your e-visit answers were reviewed by a board certified advanced clinical practitioner to complete your personal care plan. Depending upon the condition, your plan could have included both  over the counter or prescription medications.  Please review your pharmacy choice. Make sure the pharmacy is open so you can pick up prescription now. If there is a problem, you may contact your provider through [employer] and have the prescription routed to another pharmacy.  Your safety is important to [place]. If you have drug allergies check your prescription carefully.   For the next 24 hours you can use MyChart to ask  questions about today's visit, request a non-urgent call back, or ask for a work or school excuse. You will get an email in the next two days asking about your experience. I hope that your e-visit has been valuable and will speed your recovery.  Approximately 5 minutes was spent documenting and reviewing patient's chart.

## 2021-08-04 ENCOUNTER — Telehealth: Payer: BC Managed Care – PPO | Admitting: Emergency Medicine

## 2021-08-04 ENCOUNTER — Encounter: Payer: Self-pay | Admitting: Emergency Medicine

## 2021-08-04 DIAGNOSIS — B001 Herpesviral vesicular dermatitis: Secondary | ICD-10-CM

## 2021-08-04 MED ORDER — VALACYCLOVIR HCL 1 G PO TABS: 2000.0000 mg | ORAL_TABLET | Freq: Two times a day (BID) | ORAL | 0 refills | Status: AC

## 2021-08-04 NOTE — Progress Notes (Signed)
I have spent 5 minutes in review of e-visit questionnaire, review and updating patient chart, medical decision making and response to patient.   Lestine Box, PA-C

## 2021-08-04 NOTE — Progress Notes (Signed)

## 2021-08-24 ENCOUNTER — Telehealth: Payer: BC Managed Care – PPO | Admitting: Emergency Medicine

## 2021-08-24 DIAGNOSIS — J329 Chronic sinusitis, unspecified: Secondary | ICD-10-CM | POA: Diagnosis not present

## 2021-08-24 MED ORDER — DOXYCYCLINE HYCLATE 100 MG PO CAPS: 100.0000 mg | ORAL_CAPSULE | Freq: Two times a day (BID) | ORAL | 0 refills | Status: DC

## 2021-08-24 NOTE — Progress Notes (Signed)
E-Visit for Sinus Problems  We are sorry that you are not feeling well.  Here is how we plan to help!  Based on what you have shared with me it looks like you have sinusitis.  Sinusitis is inflammation and infection in the sinus cavities of the head.  Based on your presentation I believe you most likely have Acute Bacterial Sinusitis.  This is an infection caused by bacteria and is treated with antibiotics. I have prescribed Doxycycline 125m by mouth twice a day for 10 days. You may use an oral decongestant such as Mucinex D or if you have glaucoma or high blood pressure use plain Mucinex. Saline nasal spray help and can safely be used as often as needed for congestion.  If you develop worsening sinus pain, fever or notice severe headache and vision changes, or if symptoms are not better after completion of antibiotic, please schedule an appointment with a health care provider.    Sinus infections are not as easily transmitted as other respiratory infection, however we still recommend that you avoid close contact with loved ones, especially the very young and elderly.  Remember to wash your hands thoroughly throughout the day as this is the number one way to prevent the spread of infection!  Home Care: Only take medications as instructed by your medical team. Complete the entire course of an antibiotic. Do not take these medications with alcohol. A steam or ultrasonic humidifier can help congestion.  You can place a towel over your head and breathe in the steam from hot water coming from a faucet. Avoid close contacts especially the very young and the elderly. Cover your mouth when you cough or sneeze. Always remember to wash your hands.  Get Help Right Away If: You develop worsening fever or sinus pain. You develop a severe head ache or visual changes. Your symptoms persist after you have completed your treatment plan.  Make sure you Understand these instructions. Will watch your  condition. Will get help right away if you are not doing well or get worse.  Thank you for choosing an e-visit.  Your e-visit answers were reviewed by a board certified advanced clinical practitioner to complete your personal care plan. Depending upon the condition, your plan could have included both over the counter or prescription medications.  Please review your pharmacy choice. Make sure the pharmacy is open so you can pick up prescription now. If there is a problem, you may contact your provider through [employer] have the prescription routed to another pharmacy.  Your safety is important to [place] If you have drug allergies check your prescription carefully.   For the next 24 hours you can use MyChart to ask questions about today's visit, request a non-urgent call back, or ask for a work or school excuse. You will get an email in the next two days asking about your experience. I hope that your e-visit has been valuable and will speed your recovery.  Approximately 5 minutes was used in reviewing the patient's chart, questionnaire, prescribing medications, and documentation.

## 2021-10-09 ENCOUNTER — Encounter (INDEPENDENT_AMBULATORY_CARE_PROVIDER_SITE_OTHER): Payer: Self-pay | Admitting: Internal Medicine

## 2021-10-09 ENCOUNTER — Ambulatory Visit (INDEPENDENT_AMBULATORY_CARE_PROVIDER_SITE_OTHER): Payer: BC Managed Care – PPO | Admitting: Internal Medicine

## 2021-10-09 VITALS — BP 120/81 | HR 83 | Temp 98.2°F | Ht 65.0 in | Wt 156.9 lb

## 2021-10-09 DIAGNOSIS — Z8619 Personal history of other infectious and parasitic diseases: Secondary | ICD-10-CM

## 2021-10-09 DIAGNOSIS — K5080 Crohn's disease of both small and large intestine without complications: Secondary | ICD-10-CM

## 2021-10-09 NOTE — Progress Notes (Signed)
Presenting complaint; ? ?Follow-up for Crohn disease. ? ?Database and subjective: ? ?Patient is 46 year old Caucasian female who is here for scheduled visit.  She was last seen in March 2022. ?She has 26-year history of ileal and rectal Crohn disease. ?Colonoscopy in February 2017 revealed normal terminal ileum and colonoscopy except mild distal proctitis and she was treated with Canasa suppositories.  She had been maintained on oral mesalamine which was subsequently discontinued in August 2022 when she was begun on Humira/adalimumab for arthritis and she is being followed in rheumatology clinic in [place]. ?Last year she was having epigastric and chest pain.  Ultrasound was negative for cholelithiasis.  Dr. Gerarda Fraction treated for H. pylori infection with resolution of her symptoms. ? ?She says she is doing well.  She has developed constipation and has been using polyethylene glycol.  She generally has 1 formed stool daily.  She denies abdominal pain or cramping.  Her appetite is very good.  She denies melena or rectal bleeding.  She does not take NSAIDs.  She is not having any side effects with adalimumab. ?She decided to go on Optiva and lost 30 pounds in 1 month.  However she developed constipation and impaction.  She decided to go off the diet and weight has come back. ?She says she did not have any follow-up testing to document H. pylori eradication. ? ?Current Medications: ?Outpatient Encounter Medications as of 10/09/2021  ?Medication Sig  ? Adalimumab 40 MG/0.8ML PNKT Inject into the skin every 14 (fourteen) days.   ? b complex vitamins capsule Take 1 capsule by mouth daily.  ? Calcium-Phosphorus-Vitamin D (CALCIUM GUMMIES PO) Take by mouth daily.   ? cyclobenzaprine (FLEXERIL) 10 MG tablet Take 1 tablet (10 mg total) by mouth every 8 (eight) hours as needed for muscle spasms.  ? FIBER SELECT GUMMIES PO Take 1 tablet by mouth daily.  ? Multiple Vitamins-Minerals (MULTI ADULT GUMMIES PO) Take by mouth daily.  ?  polyethylene glycol (MIRALAX / GLYCOLAX) 17 g packet Take 17 g by mouth daily.  ? Probiotic Product (PROBIOTIC PO) Take by mouth daily.  ? mesalamine (PENTASA) 500 MG CR capsule TAKE 2 CAPSULES BY MOUTH 4 TIMES A DAY (Patient not taking: Reported on 10/09/2021)  ? [DISCONTINUED] benzonatate (TESSALON PERLES) 100 MG capsule Take 1 capsule (100 mg total) by mouth 3 (three) times daily as needed.  ? [DISCONTINUED] doxycycline (VIBRAMYCIN) 100 MG capsule Take 1 capsule (100 mg total) by mouth 2 (two) times daily.  ? [DISCONTINUED] fluconazole (DIFLUCAN) 150 MG tablet Take 150 mg by mouth. Patient is taking 1 by mouth weekly on Sundays.  ? [DISCONTINUED] Omega-3 Fatty Acids (FISH OIL) 1000 MG CAPS Take 1,000 mg by mouth daily.  ? [DISCONTINUED] pantoprazole (PROTONIX) 40 MG tablet Take 1 tablet (40 mg total) by mouth 2 (two) times daily before a meal.  ? [DISCONTINUED] predniSONE (STERAPRED UNI-PAK 21 TAB) 10 MG (21) TBPK tablet Use as directed  ? ?No facility-administered encounter medications on file as of 10/09/2021.  ? ? ? ?Objective: ?Blood pressure 120/81, pulse 83, temperature 98.2 ?F (36.8 ?C), temperature source Oral, height 5' 5"  (1.651 m), weight 156 lb 14.4 oz (71.2 kg). ?Patient is alert and in no acute distress. ?Conjunctiva is pink. Sclera is nonicteric ?Oropharyngeal mucosa is normal. ?No neck masses or thyromegaly noted. ?Cardiac exam with regular rhythm normal S1 and S2. No murmur or gallop noted. ?Lungs are clear to auscultation. ?Abdomen is symmetrical soft and nontender with organomegaly or masses. ?No LE edema or  clubbing noted. ? ?Labs/studies Results: ? ? ? ?  Latest Ref Rng & Units 11/30/2019  ?  3:27 PM 03/13/2016  ?  4:19 PM 07/13/2015  ?  4:10 PM  ?CBC  ?WBC 3.8 - 10.8 Thousand/uL 7.4   9.0   8.9    ?Hemoglobin 11.7 - 15.5 g/dL 13.6   12.3   13.0    ?Hematocrit 35.0 - 45.0 % 41.6   37.7   39.1    ?Platelets 140 - 400 Thousand/uL 291   285   285    ?  ? ?  Latest Ref Rng & Units 11/30/2019  ?  3:27 PM  02/24/2013  ? 11:30 AM 05/03/2011  ?  7:51 PM  ?CMP  ?Glucose 65 - 99 mg/dL 81   82   93    ?BUN 7 - 25 mg/dL 12   13   8     ?Creatinine 0.50 - 1.10 mg/dL 0.71   0.65   0.63    ?Sodium 135 - 146 mmol/L 139   137   135    ?Potassium 3.5 - 5.3 mmol/L 4.0   4.4   3.7    ?Chloride 98 - 110 mmol/L 103   102   98    ?CO2 20 - 32 mmol/L 30   29   29     ?Calcium 8.6 - 10.2 mg/dL 9.4   9.5   9.9    ?Total Protein 6.1 - 8.1 g/dL 6.8   7.0     ?Total Bilirubin 0.2 - 1.2 mg/dL 0.3   0.3     ?Alkaline Phos 39 - 117 U/L  73     ?AST 10 - 30 U/L 13   15     ?ALT 6 - 29 U/L 11   12     ?  ? ?  Latest Ref Rng & Units 11/30/2019  ?  3:27 PM 02/24/2013  ? 11:30 AM  ?Hepatic Function  ?Total Protein 6.1 - 8.1 g/dL 6.8   7.0    ?Albumin 3.5 - 5.2 g/dL  4.4    ?AST 10 - 30 U/L 13   15    ?ALT 6 - 29 U/L 11   12    ?Alk Phosphatase 39 - 117 U/L  73    ?Total Bilirubin 0.2 - 1.2 mg/dL 0.3   0.3    ?  ?Lab Results  ?Component Value Date  ? CRP 0.3 11/30/2019  ?  ? ? ?Assessment: ? ?#1.  Ileal and rectal Crohn disease with indolent course.  Disease duration 26 years.  Last colonoscopy was in February 2017.  She has been maintained on oral mesalamine all these years until she was begun on Humira/adalimumab arthritis felt to be due to inflammatory bowel disease.  Therefore mesalamine was stopped about 8 months ago and she is doing well.  She appears to be in remission.  She is overdue for surveillance colonoscopy. ? ?#2.  Constipation.  Constipation started when she decided to go on a diet to lose weight.  She is doing well with polyethylene glycol. ? ?#3.  History of H. pylori infection.  She was treated over a year ago. ? ? ?Plan: ? ?Patient will go to the lab for CBC with differential, comprehensive chemistry panel, CRP and H. pylori stool antigen. ?Office visit in 6 months with Dr. Jenetta Downer. ? ? ? ? ? ?

## 2021-10-09 NOTE — Patient Instructions (Signed)
Physician will call with results of blood and stool test when completed. ?

## 2021-10-10 LAB — CBC WITH DIFFERENTIAL/PLATELET
Basophils Absolute: 0.1 10*3/uL (ref 0.0–0.2)
Basos: 1 %
EOS (ABSOLUTE): 0.2 10*3/uL (ref 0.0–0.4)
Eos: 2 %
Hematocrit: 41.2 % (ref 34.0–46.6)
Hemoglobin: 13.5 g/dL (ref 11.1–15.9)
Immature Grans (Abs): 0 10*3/uL (ref 0.0–0.1)
Immature Granulocytes: 0 %
Lymphocytes Absolute: 3 10*3/uL (ref 0.7–3.1)
Lymphs: 37 %
MCH: 28.5 pg (ref 26.6–33.0)
MCHC: 32.8 g/dL (ref 31.5–35.7)
MCV: 87 fL (ref 79–97)
Monocytes Absolute: 0.8 10*3/uL (ref 0.1–0.9)
Monocytes: 10 %
Neutrophils Absolute: 4 10*3/uL (ref 1.4–7.0)
Neutrophils: 50 %
Platelets: 319 10*3/uL (ref 150–450)
RBC: 4.73 x10E6/uL (ref 3.77–5.28)
RDW: 12.6 % (ref 11.7–15.4)
WBC: 8 10*3/uL (ref 3.4–10.8)

## 2021-10-10 LAB — COMPREHENSIVE METABOLIC PANEL
ALT: 16 IU/L (ref 0–32)
AST: 15 IU/L (ref 0–40)
Albumin/Globulin Ratio: 1.9 (ref 1.2–2.2)
Albumin: 4.9 g/dL — ABNORMAL HIGH (ref 3.8–4.8)
Alkaline Phosphatase: 97 IU/L (ref 44–121)
BUN/Creatinine Ratio: 18 (ref 9–23)
BUN: 12 mg/dL (ref 6–24)
Bilirubin Total: <0.2 mg/dL (ref 0.0–1.2)
CO2: 26 mmol/L (ref 20–29)
Calcium: 9.8 mg/dL (ref 8.7–10.2)
Chloride: 101 mmol/L (ref 96–106)
Creatinine, Ser: 0.67 mg/dL (ref 0.57–1.00)
Globulin, Total: 2.6 g/dL (ref 1.5–4.5)
Glucose: 86 mg/dL (ref 70–99)
Potassium: 4.7 mmol/L (ref 3.5–5.2)
Sodium: 139 mmol/L (ref 134–144)
Total Protein: 7.5 g/dL (ref 6.0–8.5)
eGFR: 110 mL/min/{1.73_m2} (ref >59)

## 2021-10-10 LAB — C-REACTIVE PROTEIN: CRP: <1 mg/L (ref 0–10)

## 2021-10-13 LAB — H. PYLORI ANTIGEN, STOOL: H pylori Ag, Stl: NEGATIVE

## 2021-10-20 ENCOUNTER — Telehealth: Payer: BC Managed Care – PPO | Admitting: Physician Assistant

## 2021-10-20 DIAGNOSIS — N3000 Acute cystitis without hematuria: Secondary | ICD-10-CM | POA: Diagnosis not present

## 2021-10-20 MED ORDER — NITROFURANTOIN MONOHYD MACRO 100 MG PO CAPS: 100.0000 mg | ORAL_CAPSULE | Freq: Two times a day (BID) | ORAL | 0 refills | Status: AC

## 2021-10-20 NOTE — Progress Notes (Signed)
E-Visit for Urinary Problems ? ?We are sorry that you are not feeling well.  Here is how we plan to help! ? ?Based on what you shared with me it looks like you most likely have a simple urinary tract infection. ? ?A UTI (Urinary Tract Infection) is a bacterial infection of the bladder. ? ?Most cases of urinary tract infections are simple to treat but a key part of your care is to encourage you to drink plenty of fluids and watch your symptoms carefully. ? ?I have prescribed MacroBid 100 mg twice a day for 5 days.  Your symptoms should gradually improve. Call us if the burning in your urine worsens, you develop worsening fever, back pain or pelvic pain or if your symptoms do not resolve after completing the antibiotic. ? ?Urinary tract infections can be prevented by drinking plenty of water to keep your body hydrated.  Also be sure when you wipe, wipe from front to back and don't hold it in!  If possible, empty your bladder every 4 hours. ? ?HOME CARE ?Drink plenty of fluids ?Compete the full course of the antibiotics even if the symptoms resolve ?Remember, when you need to go?go. Holding in your urine can increase the likelihood of getting a UTI! ?GET HELP RIGHT AWAY IF: ?You cannot urinate ?You get a high fever ?Worsening back pain occurs ?You see blood in your urine ?You feel sick to your stomach or throw up ?You feel like you are going to pass out ? ?MAKE SURE YOU  ?Understand these instructions. ?Will watch your condition. ?Will get help right away if you are not doing well or get worse. ? ? ?Thank you for choosing an e-visit. ? ?Your e-visit answers were reviewed by a board certified advanced clinical practitioner to complete your personal care plan. Depending upon the condition, your plan could have included both over the counter or prescription medications. ? ?Please review your pharmacy choice. Make sure the pharmacy is open so you can pick up prescription now. If there is a problem, you may contact your  provider through [employer] and have the prescription routed to another pharmacy.  Your safety is important to [place]. If you have drug allergies check your prescription carefully.  ? ?For the next 24 hours you can use MyChart to ask questions about today's visit, request a non-urgent call back, or ask for a work or school excuse. ?You will get an email in the next two days asking about your experience. I hope that your e-visit has been valuable and will speed your recovery. ? ?I have spent 5 minutes in review of e-visit questionnaire, review and updating patient chart, medical decision making and response to patient.  ? ?Lonetta Blassingame S Mayers, PA-C ? ? ? ? ?

## 2022-02-04 ENCOUNTER — Telehealth (INDEPENDENT_AMBULATORY_CARE_PROVIDER_SITE_OTHER): Payer: Self-pay

## 2022-02-04 ENCOUNTER — Other Ambulatory Visit (INDEPENDENT_AMBULATORY_CARE_PROVIDER_SITE_OTHER): Payer: Self-pay | Admitting: Gastroenterology

## 2022-02-04 DIAGNOSIS — R1013 Epigastric pain: Secondary | ICD-10-CM

## 2022-02-04 MED ORDER — OMEPRAZOLE 40 MG PO CPDR: 40.0000 mg | DELAYED_RELEASE_CAPSULE | Freq: Every day | ORAL | 3 refills | Status: DC

## 2022-02-04 NOTE — Telephone Encounter (Signed)
Patient made aware of all.

## 2022-02-04 NOTE — Telephone Encounter (Signed)
Patient calling today stating she thinks her stomach ulcers have returned.Needs something called in for this. (She is on her way to the beach).

## 2022-02-04 NOTE — Telephone Encounter (Signed)
Patient with history of Crohns on Humira every other week. Patient states she feels like stomach ulcers have returned. She has been having the same symptoms as before with burping, chest discomfort, stomach hurts after eating. She denies any nausea,vomiting ,fever. She says the last time this happened Dr. Dereck Leep advised if happened again to call this office. He called her in an antibiotic the last time and she is not on any ppi or Carafate. Patient currently at the [facility] [place] at [facility], she will be there until Saturday. Please advise.

## 2022-02-04 NOTE — Telephone Encounter (Signed)
No upper performed at [facility], unclear where the diagnosis of ulcers came from. She can restart omeprazole 40 mg every day (I will send this to her pharmacy).  She will need to follow-up in the clinic for further steps in management.

## 2022-02-25 ENCOUNTER — Ambulatory Visit
Admission: EM | Admit: 2022-02-25 | Discharge: 2022-02-25 | Disposition: A | Discharge status: Home / Self Care | Payer: BC Managed Care – PPO | Attending: Family Medicine | Admitting: Family Medicine

## 2022-02-25 DIAGNOSIS — R42 Dizziness and giddiness: Secondary | ICD-10-CM | POA: Diagnosis not present

## 2022-02-25 DIAGNOSIS — H6983 Other specified disorders of Eustachian tube, bilateral: Secondary | ICD-10-CM

## 2022-02-25 MED ORDER — MECLIZINE HCL 25 MG PO TABS: 25.0000 mg | ORAL_TABLET | Freq: Three times a day (TID) | ORAL | 0 refills | Status: DC | PRN

## 2022-02-25 MED ORDER — FLUTICASONE PROPIONATE 50 MCG/ACT NA SUSP: 1.0000 | Freq: Two times a day (BID) | NASAL | 2 refills | Status: DC

## 2022-02-25 MED ORDER — PSEUDOEPHEDRINE HCL ER 120 MG PO TB12: 120.0000 mg | ORAL_TABLET | Freq: Two times a day (BID) | ORAL | 0 refills | Status: DC | PRN

## 2022-02-25 MED ORDER — PREDNISONE 20 MG PO TABS: 40.0000 mg | ORAL_TABLET | Freq: Every day | ORAL | 0 refills | Status: DC

## 2022-02-25 NOTE — ED Provider Notes (Signed)
[facility]    CSN: [id number] Arrival date & time: 02/25/22  7169      History   Chief Complaint Chief Complaint  Patient presents with   Dizziness   Ear Fullness    HPI Tamara Martin is a 46 y.o. female.   Patient presenting today with several week history of intermittent bilateral ear fullness, ringing, occasional dizzy spells.  States symptoms became worse this morning and when she moves her head certain ways she is getting some room spinning dizziness sensation and having worsening ear pressure.  Denies fever, chills, recent upper respiratory infection, ear drainage, fevers, headache, nausea vomiting.  Tried some peroxide in her ear several days ago with no relief.  Notes she does have a history of seasonal allergies not currently on anything.   Past Medical History:  Diagnosis Date   Anxiety 03/04/2013   Arthritis    Crohn's disease of colon ([place])    Dermoid cyst of eyebrow 10/2015   left   Fibroadenoma of right breast 02/27/2017   Headache, menstrual migraine    Patient Active Problem List   Diagnosis Date Noted   History of Helicobacter pylori infection 10/09/2021   Bacterial infection due to H. pylori 09/19/2020   Atypical chest pain 09/19/2020   Encounter for screening fecal occult blood testing 04/11/2020   Fibroadenoma of right breast 02/27/2017   Encounter for gynecological examination with Papanicolaou smear of cervix 03/14/2016   Rectocele 03/14/2016   Anxiety 03/04/2013   Fatigue 02/24/2013   Irregular menses 02/24/2013   History of migraine headaches 10/14/2012   Crohn's disease ([place]) 08/12/2011   Arthritis of knee, left 08/12/2011   Past Surgical History:  Procedure Laterality Date   COLONOSCOPY  06/19/2005   COLONOSCOPY N/A 08/10/2015   Procedure: COLONOSCOPY;  Surgeon: Rogene Houston, MD;  [facility];  Service: Endoscopy;  Laterality: N/A;  1:25   EYE SURGERY     benign tumor   MASS EXCISION Left 10/27/2015    Procedure: EXCISION MASS LEFT BROW ( EXCISION OF SUBMUSCULAR MASS LEFT BROW 2.5CM);  Surgeon: Irene Limbo, MD;  Location: [facility];  Service: Plastics;  Laterality: Left;    OB History     Gravida  4   Para  3   Term      Preterm      AB  1   Living  3      SAB  1   IAB      Ectopic      Multiple      Live Births  3            Home Medications    Prior to Admission medications   Medication Sig Start Date End Date Taking? Authorizing Provider  fluticasone (FLONASE) 50 MCG/ACT nasal spray Place 1 spray into both nostrils 2 (two) times daily. 02/25/22  Yes Volney American, PA-C  meclizine (ANTIVERT) 25 MG tablet Take 1 tablet (25 mg total) by mouth 3 (three) times daily as needed for dizziness. 02/25/22  Yes Volney American, PA-C  predniSONE (DELTASONE) 20 MG tablet Take 2 tablets (40 mg total) by mouth daily with breakfast. 02/25/22  Yes Volney American, PA-C  pseudoephedrine (SUDAFED 12 HOUR) 120 MG 12 hr tablet Take 1 tablet (120 mg total) by mouth every 12 (twelve) hours as needed for congestion. 02/25/22  Yes Volney American, PA-C  Adalimumab 40 MG/0.8ML PNKT Inject into the skin every 14 (fourteen) days.  [provider]  b complex vitamins capsule Take 1 capsule by mouth daily.    [provider]  Calcium-Phosphorus-Vitamin D (CALCIUM GUMMIES PO) Take by mouth daily.     [provider]  cyclobenzaprine (FLEXERIL) 10 MG tablet Take 1 tablet (10 mg total) by mouth every 8 (eight) hours as needed for muscle spasms. 01/21/19   Estill Dooms, NP  FIBER SELECT GUMMIES PO Take 1 tablet by mouth daily.    [provider]  Multiple Vitamins-Minerals (MULTI ADULT GUMMIES PO) Take by mouth daily.    [provider]  omeprazole (PRILOSEC) 40 MG capsule Take 1 capsule (40 mg total) by mouth daily. 02/04/22   Harvel Quale, MD  polyethylene glycol (MIRALAX / GLYCOLAX)  17 g packet Take 17 g by mouth daily.    [provider]  Probiotic Product (PROBIOTIC PO) Take by mouth daily.    [provider]    Family History Family History  Problem Relation Age of Onset   Atrial fibrillation Mother    Cirrhosis Father    Cancer Father        stomach and bone   Cancer Maternal Aunt        lung   Cancer Maternal Uncle        bones   Diabetes Maternal Grandmother    Hypertension Maternal Grandmother    Diabetes Maternal Grandfather     Social History Social History   Tobacco Use   Smoking status: Never    Passive exposure: Never   Smokeless tobacco: Never  Vaping Use   Vaping Use: Never used  Substance Use Topics   Alcohol use: No    Alcohol/week: 0.0 standard drinks of alcohol   Drug use: No     Allergies   Penicillins   Review of Systems Review of Systems Per HPI  Physical Exam Triage Vital Signs ED Triage Vitals  Enc Vitals Group     BP 02/25/22 1009 133/81     Pulse Rate 02/25/22 1009 77     Resp 02/25/22 1009 18     Temp 02/25/22 1009 98.1 F (36.7 C)     Temp src --      SpO2 02/25/22 1009 99 %     Weight --      Height --      Head Circumference --      Peak Flow --      Pain Score 02/25/22 1001 3     Pain Loc --      Pain Edu? --      Excl. in [place]? --    No data found.  Updated Vital Signs BP 133/81   Pulse 77   Temp 98.1 F (36.7 C)   Resp 18   SpO2 99%   Visual Acuity Right Eye Distance:   Left Eye Distance:   Bilateral Distance:    Right Eye Near:   Left Eye Near:    Bilateral Near:     Physical Exam Vitals and nursing note reviewed.  Constitutional:      Appearance: Normal appearance. She is not ill-appearing.  HENT:     Head: Atraumatic.     Ears:     Comments: Bilateral middle ear effusion    Nose: Nose normal.     Mouth/Throat:     Mouth: Mucous membranes are moist.     Pharynx: Oropharynx is clear.  Eyes:     Extraocular Movements: Extraocular movements intact.  Conjunctiva/sclera: Conjunctivae normal.  Cardiovascular:     Rate and Rhythm: Normal rate and regular rhythm.     Heart sounds: Normal heart sounds.  Pulmonary:     Effort: Pulmonary effort is normal.     Breath sounds: Normal breath sounds. No wheezing or rales.  Musculoskeletal:        General: Normal range of motion.     Cervical back: Normal range of motion and neck supple.  Skin:    General: Skin is warm and dry.  Neurological:     General: No focal deficit present.     Mental Status: She is alert and oriented to person, place, and time.     Cranial Nerves: No cranial nerve deficit.     Motor: No weakness.     Gait: Gait normal.  Psychiatric:        Mood and Affect: Mood normal.        Thought Content: Thought content normal.        Judgment: Judgment normal.    UC Treatments / Results  Labs (all labs ordered are listed, but only abnormal results are displayed) Labs Reviewed - No data to display  EKG  Radiology No results found.  Procedures Procedures (including critical care time)  Medications Ordered in UC Medications - No data to display  Initial Impression / Assessment and Plan / UC Course  I have reviewed the triage vital signs and the nursing notes.  Pertinent labs & imaging results that were available during my care of the patient were reviewed by me and considered in my medical decision making (see chart for details).     Vital signs and exam very reassuring with no focal deficits today.  Suspect middle ear effusion causing inner ear pressure leading to positional vertigo.  We will treat with course of prednisone, Flonase, Sudafed and meclizine as needed for vertigo symptoms.  Discussed slow deliberate movements of the head, Epley maneuvers as needed.  Work note given in case needed.  Return for worsening symptoms.  Final Clinical Impressions(s) / UC Diagnoses   Final diagnoses:  Eustachian tube dysfunction, bilateral  Vertigo   Discharge  Instructions   None    ED Prescriptions     Medication Sig Dispense Auth. Provider   predniSONE (DELTASONE) 20 MG tablet Take 2 tablets (40 mg total) by mouth daily with breakfast. 10 tablet Volney American, PA-C   fluticasone [facility]) 50 MCG/ACT nasal spray Place 1 spray into both nostrils 2 (two) times daily. 16 g Volney American, [place]   pseudoephedrine (SUDAFED 12 HOUR) 120 MG 12 hr tablet Take 1 tablet (120 mg total) by mouth every 12 (twelve) hours as needed for congestion. 20 tablet Volney American, [place]   meclizine (ANTIVERT) 25 MG tablet Take 1 tablet (25 mg total) by mouth 3 (three) times daily as needed for dizziness. 30 tablet Volney American, [place]      PDMP not reviewed this encounter.   Volney American, [place] 02/25/22 1042

## 2022-02-25 NOTE — ED Triage Notes (Signed)
Pt presents with bilateral ear fullness, used peroxide in ears and became dizzy , dizziness  has been intermittent

## 2022-03-07 IMAGING — MG MM DIGITAL DIAGNOSTIC UNILAT*L* W/ TOMO W/ CAD
4 series · 4 of 12 positions shown · non-contrast
Comparison: Previous exam(s).

CLINICAL DATA: Patient returns after screening study for evaluation
of possible LEFT breast asymmetry.

EXAM:
DIGITAL DIAGNOSTIC UNILATERAL LEFT MAMMOGRAM WITH TOMOSYNTHESIS AND
CAD
TECHNIQUE: Left digital diagnostic mammography and breast tomosynthesis was
performed. The images were evaluated with computer-aided detection.

[L MLO synth-2D]
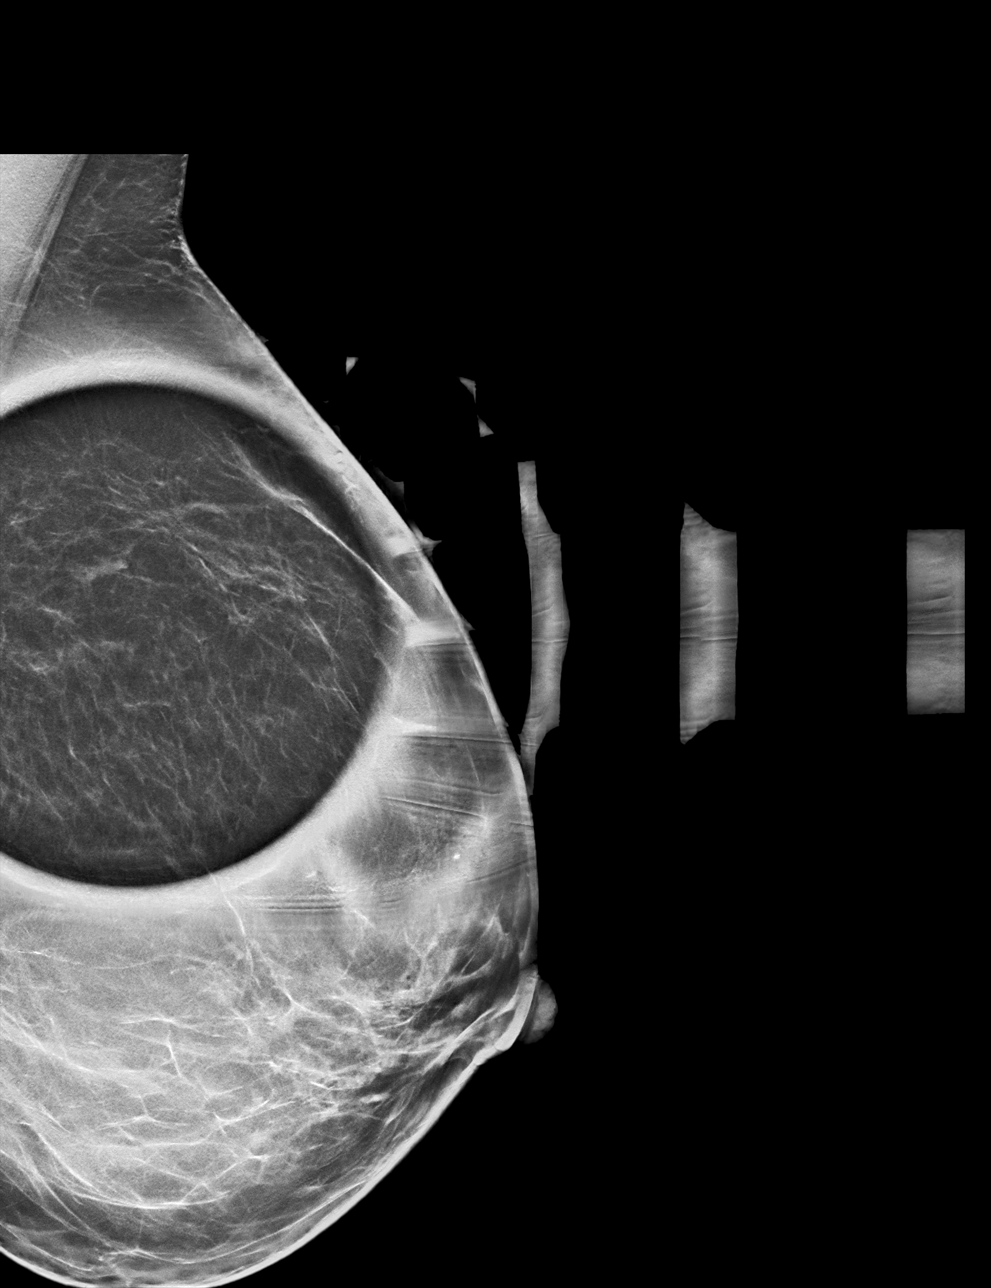

[L CC synth-2D]
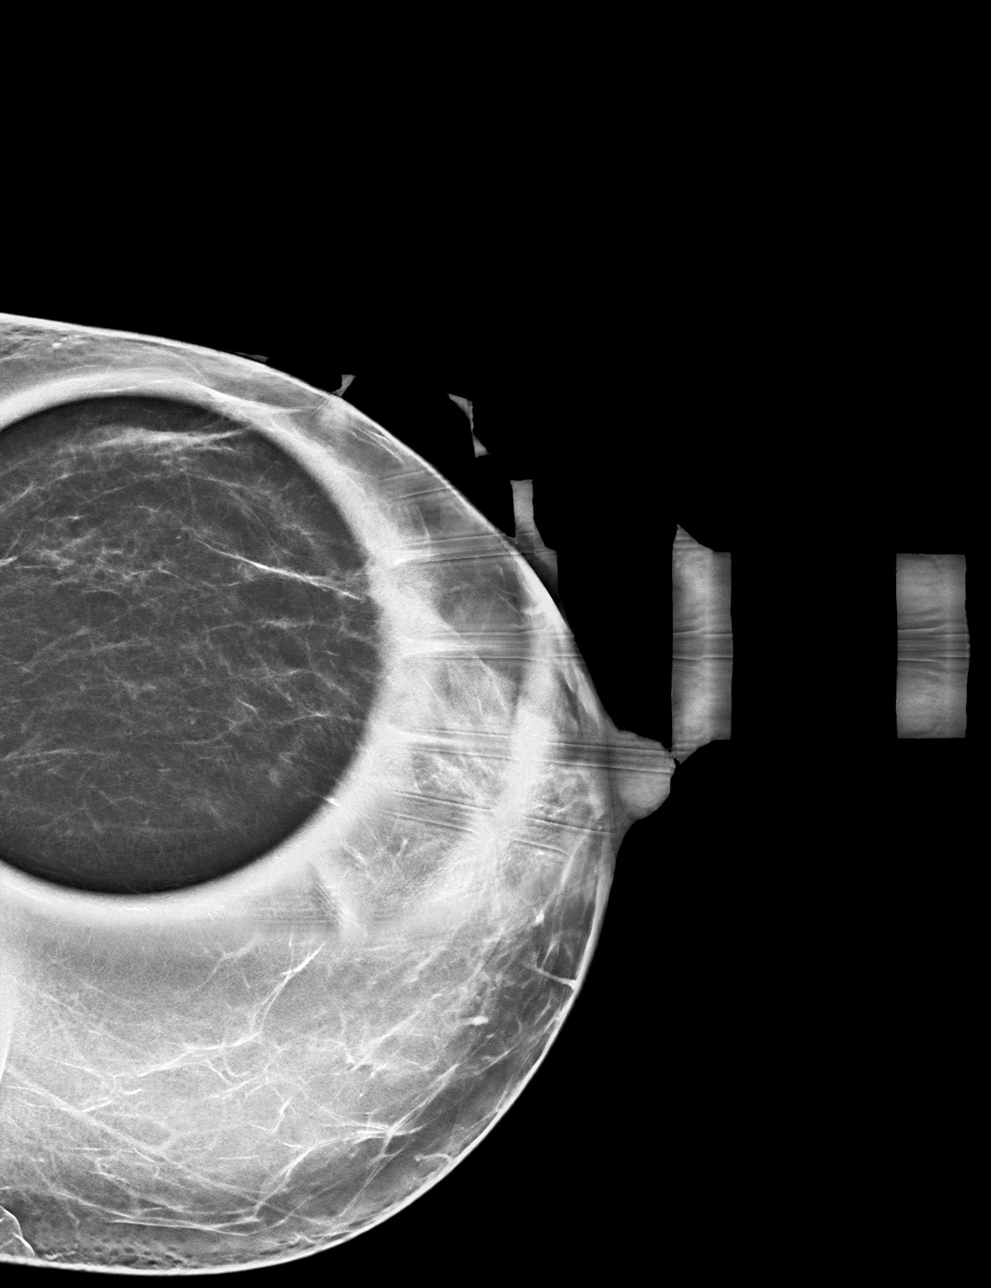

[L CC tomo · tomo slice 25/50.0]
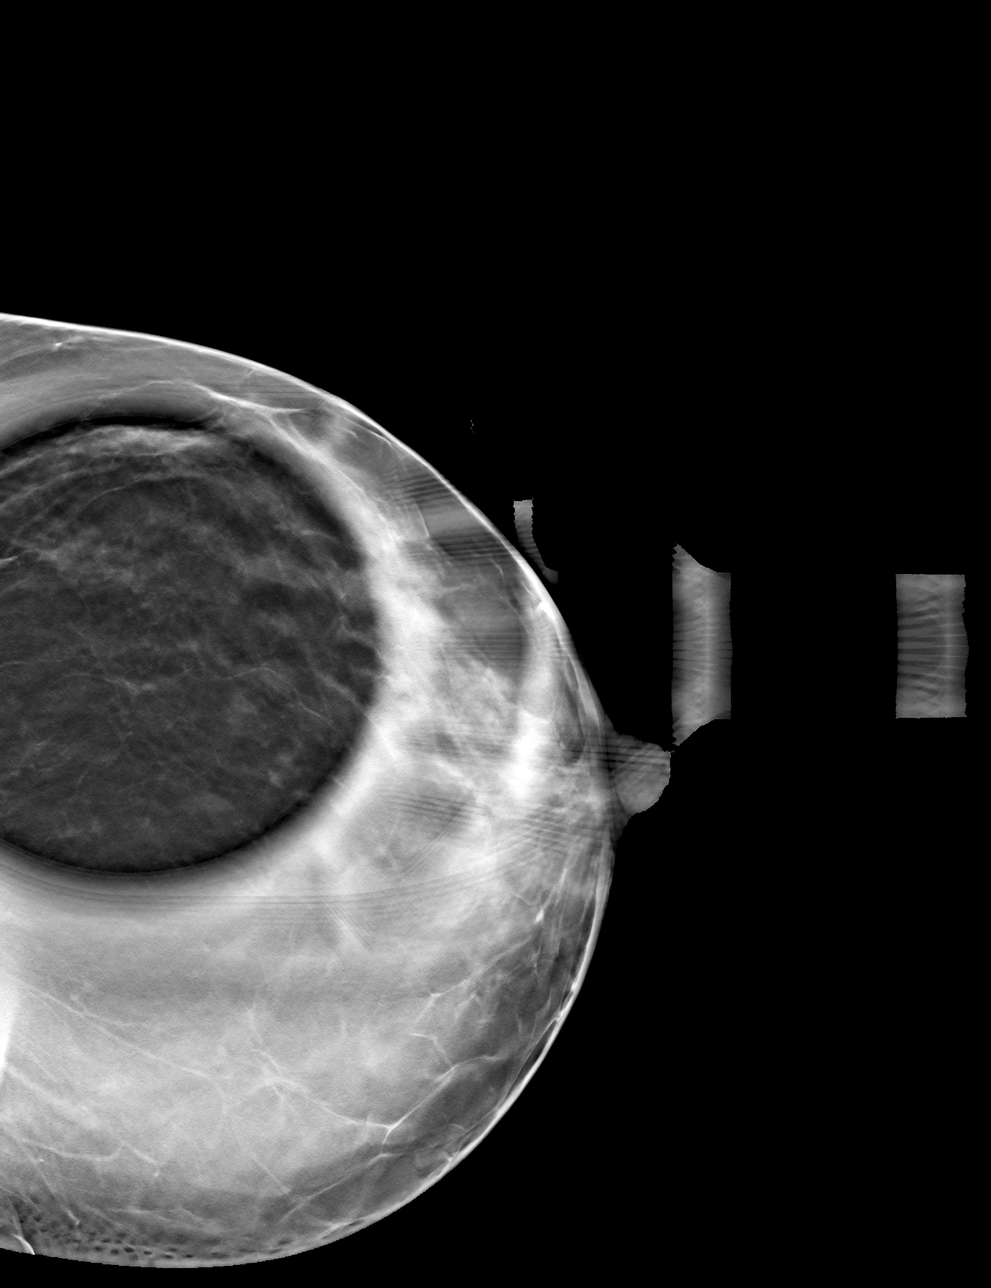

[L MLO tomo · tomo slice 28/55.0]
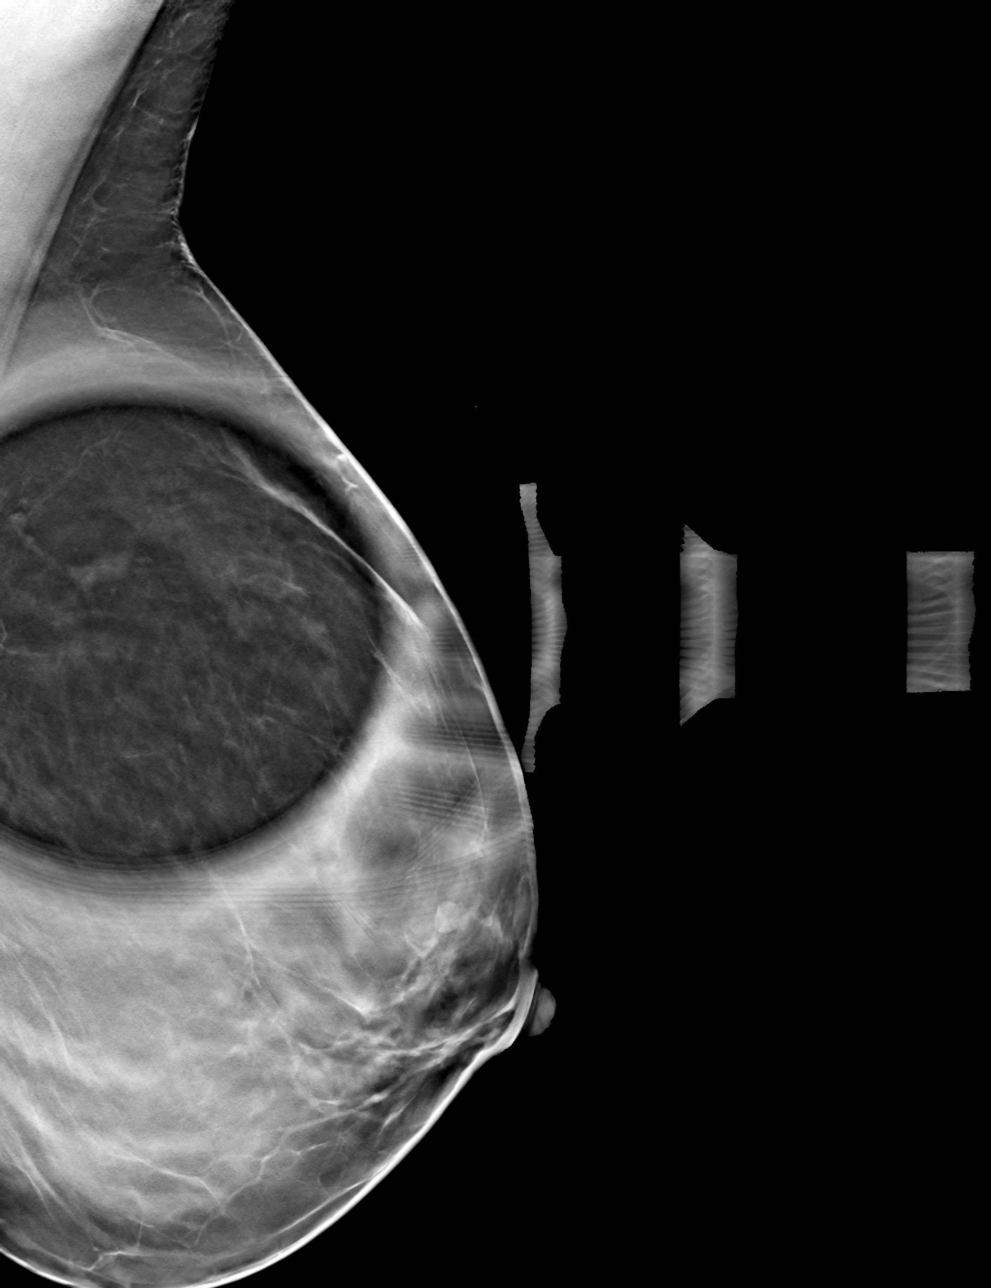

[4 of 12 positions shown; findings below may reference images not displayed]

ACR Breast Density Category b: There are scattered areas of
fibroglandular density.
FINDINGS: Additional 2-D and 3-D images are performed. These views show no
persistent asymmetry or mass in the LATERAL portion of the RIGHT
breast. No suspicious mass, distortion, or microcalcifications are
identified to suggest presence of malignancy.
IMPRESSION: No mammographic evidence for malignancy.

RECOMMENDATION:
Screening mammogram in one year.(Code:[id number])

I have discussed the findings and recommendations with the patient.
If applicable, a reminder letter will be sent to the patient
regarding the next appointment.

BI-RADS CATEGORY  1: Negative.

## 2022-03-20 ENCOUNTER — Encounter: Payer: Self-pay | Admitting: Adult Health

## 2022-03-20 ENCOUNTER — Ambulatory Visit: Payer: BC Managed Care – PPO | Admitting: Adult Health

## 2022-03-20 VITALS — BP 130/78 | HR 91 | Ht 65.0 in | Wt 158.0 lb

## 2022-03-20 DIAGNOSIS — L299 Pruritus, unspecified: Secondary | ICD-10-CM | POA: Diagnosis not present

## 2022-03-20 DIAGNOSIS — R1031 Right lower quadrant pain: Secondary | ICD-10-CM | POA: Diagnosis not present

## 2022-03-20 DIAGNOSIS — F419 Anxiety disorder, unspecified: Secondary | ICD-10-CM

## 2022-03-20 DIAGNOSIS — R42 Dizziness and giddiness: Secondary | ICD-10-CM

## 2022-03-20 MED ORDER — BUSPIRONE HCL 5 MG PO TABS: 5.0000 mg | ORAL_TABLET | Freq: Two times a day (BID) | ORAL | 3 refills | Status: DC

## 2022-03-20 MED ORDER — HYDROXYZINE HCL 10 MG PO TABS: 10.0000 mg | ORAL_TABLET | Freq: Three times a day (TID) | ORAL | 3 refills | Status: AC | PRN

## 2022-03-20 NOTE — Progress Notes (Signed)
Subjective:     Patient ID: Tamara Martin, female   DOB: 1975/10/06, 46 y.o.   MRN: [id number]  HPI Tamara Martin is a 46 year old white female, married, [id number] in complaining of RLQ pain last week but has resolved now, since period started. She has had dizziness,scalp tingles,itches, feels spacey, and anxiety feeling with elevated HR since August. Was seen at Urgent Care and PCP and treated with steroids, without relief.  Last pap was 04/11/20 was negative for malignancy and HPV.  PCP is Dr Gerarda Fraction.  Review of Systems RLQ pain last week, resolved with period +dizziness,scalp tingles,itches, feels spacey, and anxiety with elevated HR at times    Reviewed past medical,surgical, social and family history. Reviewed medications and allergies.  Objective:   Physical Exam BP 130/78 (BP Location: Left Arm, Patient Position: Sitting, Cuff Size: Normal)   Pulse 91   Ht 5' 5"  (1.651 m)   Wt 158 lb (71.7 kg)   LMP 03/17/2022   BMI 26.29 kg/m     Skin warm and dry.  Lungs: clear to ausculation bilaterally. Cardiovascular: regular rate and rhythm. CN 2-12 intact.No scalp tenderness on exam.  Pelvic: external genitalia is normal in appearance no lesions, vagina: +period blood,urethra has no lesions or masses noted, cervix:smooth and bulbous, uterus: normal size, shape and contour, non tender, no masses felt, adnexa: no masses or tenderness noted. Bladder is non tender and no masses felt.    03/20/2022    3:29 PM 04/11/2020    1:54 PM  Depression screen PHQ 2/9  Decreased Interest 0 0  Down, Depressed, Hopeless 1 0  PHQ - 2 Score 1 0  Altered sleeping 1 0  Tired, decreased energy 1 0  Change in appetite 0 0  Feeling bad or failure about yourself   0  Trouble concentrating 0 0  Moving slowly or fidgety/restless 0 0  Suicidal thoughts 0 0  PHQ-9 Score 3 0  Difficult doing work/chores Not difficult at all Not difficult at all       03/20/2022    3:31 PM 04/11/2020    1:54 PM  GAD 7 :  Generalized Anxiety Score  Nervous, Anxious, on Edge 2 0  Control/stop worrying 1 0  Worry too much - different things 1 0  Trouble relaxing 1 0  Restless 0 0  Easily annoyed or irritable 0 0  Afraid - awful might happen 1 0  Total GAD 7 Score 6 0  Anxiety Difficulty  Not difficult at all      Upstream - 03/20/22 1528       Pregnancy Intention Screening   Does the patient want to become pregnant in the next year? No    Does the patient's partner want to become pregnant in the next year? No    Would the patient like to discuss contraceptive options today? No      Contraception Wrap Up   Current Method Vasectomy    End Method Vasectomy            Examination chaperoned by Levy Pupa LPN  Assessment:     1. RLQ abdominal pain Has resolved, with period  2. Anxiety Has elevated HR, feels anxious Will rx Buspar and vistaril Meds ordered this encounter  Medications   busPIRone (BUSPAR) 5 MG tablet    Sig: Take 1 tablet (5 mg total) by mouth 2 (two) times daily.    Dispense:  60 tablet    Refill:  3    Order  Specific Question:   Supervising Provider    Answer:   Florian Buff [[id number]]   hydrOXYzine (ATARAX) 10 MG tablet    Sig: Take 1 tablet (10 mg total) by mouth 3 (three) times daily as needed.    Dispense:  30 tablet    Refill:  3    Order Specific Question:   Supervising Provider    Answer:   Elonda Husky, LUTHER H [[id number]]     3. Scalp pruritus Will rx vistaril   4. Dizzy spells Is not dizzy when changes positions, head feels spacey    Denies any blurred vision or changes If has any vision changes or speech issues go to ER   Plan:     Follow up in 3 weeks for ROS

## 2022-03-25 ENCOUNTER — Other Ambulatory Visit: Payer: Self-pay | Admitting: Adult Health

## 2022-03-25 DIAGNOSIS — R42 Dizziness and giddiness: Secondary | ICD-10-CM

## 2022-03-25 DIAGNOSIS — R1031 Right lower quadrant pain: Secondary | ICD-10-CM

## 2022-03-25 NOTE — Progress Notes (Signed)
Check CBC,CMP,TSH and free T4

## 2022-03-27 LAB — T4, FREE: Free T4: 1.3 ng/dL (ref 0.82–1.77)

## 2022-03-27 LAB — COMPREHENSIVE METABOLIC PANEL
ALT: 18 IU/L (ref 0–32)
AST: 17 IU/L (ref 0–40)
Albumin/Globulin Ratio: 1.8 (ref 1.2–2.2)
Albumin: 4.4 g/dL (ref 3.9–4.9)
Alkaline Phosphatase: 84 IU/L (ref 44–121)
BUN/Creatinine Ratio: 14 (ref 9–23)
BUN: 10 mg/dL (ref 6–24)
Bilirubin Total: 0.4 mg/dL (ref 0.0–1.2)
CO2: 24 mmol/L (ref 20–29)
Calcium: 9.6 mg/dL (ref 8.7–10.2)
Chloride: 102 mmol/L (ref 96–106)
Creatinine, Ser: 0.73 mg/dL (ref 0.57–1.00)
Globulin, Total: 2.5 g/dL (ref 1.5–4.5)
Glucose: 102 mg/dL — ABNORMAL HIGH (ref 70–99)
Potassium: 4.3 mmol/L (ref 3.5–5.2)
Sodium: 139 mmol/L (ref 134–144)
Total Protein: 6.9 g/dL (ref 6.0–8.5)
eGFR: 103 mL/min/{1.73_m2} (ref >59)

## 2022-03-27 LAB — CBC
Hematocrit: 41.3 % (ref 34.0–46.6)
Hemoglobin: 13.4 g/dL (ref 11.1–15.9)
MCH: 28.5 pg (ref 26.6–33.0)
MCHC: 32.4 g/dL (ref 31.5–35.7)
MCV: 88 fL (ref 79–97)
Platelets: 287 10*3/uL (ref 150–450)
RBC: 4.71 x10E6/uL (ref 3.77–5.28)
RDW: 12.8 % (ref 11.7–15.4)
WBC: 5.4 10*3/uL (ref 3.4–10.8)

## 2022-03-27 LAB — TSH: TSH: 1.44 u[IU]/mL (ref 0.450–4.500)

## 2022-03-28 ENCOUNTER — Ambulatory Visit: Payer: BC Managed Care – PPO | Attending: Internal Medicine | Admitting: Cardiology

## 2022-03-28 ENCOUNTER — Ambulatory Visit: Payer: BC Managed Care – PPO | Attending: Cardiology

## 2022-03-28 ENCOUNTER — Other Ambulatory Visit: Payer: Self-pay | Admitting: Cardiology

## 2022-03-28 VITALS — BP 118/82 | HR 86 | Ht 65.0 in | Wt 156.8 lb

## 2022-03-28 DIAGNOSIS — R002 Palpitations: Secondary | ICD-10-CM

## 2022-03-28 NOTE — Progress Notes (Signed)
Clinical Summary Tamara Martin is a 46 y.o.female seen as new consult  1.Palpitations - recent episodes of heart racing with associated SOB, dizziness - no specific trigger - no significant caffeine or EtOH intake.  - can wake up in the middle of the night feeling heart racing - normal TSH, K by recent labs     2. Dizziness - urgent care visit 02/25/22 - reported ear fullness, ringing, dizzy spells - room spins with turning head   SH: 52 yo daughter, 76 yo son, 86 son.  Past Medical History:  Diagnosis Date   Anxiety 03/04/2013   Arthritis    rheumatoid   Crohn's disease of colon ([place])    Dermoid cyst of eyebrow 10/2015   left   Fibroadenoma of right breast 02/27/2017   Headache, menstrual migraine      Allergies  Allergen Reactions   Penicillins Hives          Current Outpatient Medications  Medication Sig Dispense Refill   Adalimumab 40 MG/0.8ML PNKT Inject into the skin every 14 (fourteen) days.      b complex vitamins capsule Take 1 capsule by mouth daily.     busPIRone (BUSPAR) 5 MG tablet Take 1 tablet (5 mg total) by mouth 2 (two) times daily. 60 tablet 3   cyclobenzaprine (FLEXERIL) 10 MG tablet Take 1 tablet (10 mg total) by mouth every 8 (eight) hours as needed for muscle spasms. 30 tablet 1   FIBER SELECT GUMMIES PO Take 1 tablet by mouth daily.     hydrOXYzine (ATARAX) 10 MG tablet Take 1 tablet (10 mg total) by mouth 3 (three) times daily as needed. 30 tablet 3   montelukast (SINGULAIR) 10 MG tablet Take 10 mg by mouth daily.     Multiple Vitamins-Minerals (MULTI ADULT GUMMIES PO) Take by mouth daily.     omeprazole (PRILOSEC) 40 MG capsule Take 1 capsule (40 mg total) by mouth daily. 90 capsule 3   polyethylene glycol (MIRALAX / GLYCOLAX) 17 g packet Take 17 g by mouth daily.     Probiotic Product (PROBIOTIC PO) Take by mouth daily.     No current facility-administered medications for this visit.     Past Surgical History:  Procedure  Laterality Date   COLONOSCOPY  06/19/2005   COLONOSCOPY N/A 08/10/2015   Procedure: COLONOSCOPY;  Surgeon: Rogene Houston, MD;  [facility];  Service: Endoscopy;  Laterality: N/A;  1:25   EYE SURGERY     benign tumor   MASS EXCISION Left 10/27/2015   Procedure: EXCISION MASS LEFT BROW ( EXCISION OF SUBMUSCULAR MASS LEFT BROW 2.5CM);  Surgeon: Irene Limbo, MD;  Location: [place];  Service: Plastics;  Laterality: Left;     Allergies  Allergen Reactions   Penicillins Hives           Family History  Problem Relation Age of Onset   Diabetes Maternal Grandmother    Hypertension Maternal Grandmother    Kidney failure Maternal Grandmother    Diabetes Maternal Grandfather    Cirrhosis Father    Cancer Father        stomach and bone   Atrial fibrillation Mother    Cancer Maternal Aunt        lung   Cancer Maternal Uncle        bones     Social History Ms. Tamara Martin reports that she has never smoked. She has never been exposed to tobacco smoke. She  has never used smokeless tobacco. Ms. Tamara Martin reports no history of alcohol use.   Review of Systems CONSTITUTIONAL: No weight loss, fever, chills, weakness or fatigue.  HEENT: Eyes: No visual loss, blurred vision, double vision or yellow sclerae.No hearing loss, sneezing, congestion, runny nose or sore throat.  SKIN: No rash or itching.  CARDIOVASCULAR: per hpi RESPIRATORY: No shortness of breath, cough or sputum.  GASTROINTESTINAL: No anorexia, nausea, vomiting or diarrhea. No abdominal pain or blood.  GENITOURINARY: No burning on urination, no polyuria NEUROLOGICAL: +dizziness MUSCULOSKELETAL: No muscle, back pain, joint pain or stiffness.  LYMPHATICS: No enlarged nodes. No history of splenectomy.  PSYCHIATRIC: No history of depression or anxiety.  ENDOCRINOLOGIC: No reports of sweating, cold or heat intolerance. No polyuria or polydipsia.  Marland Kitchen   Physical Examination Today's Vitals   03/28/22  0856  BP: 118/82  Pulse: 86  SpO2: 99%  Weight: 156 lb 12.8 oz (71.1 kg)  Height: 5' 5"  (1.651 m)   Body mass index is 26.09 kg/m.  Gen: resting comfortably, no acute distress HEENT: no scleral icterus, pupils equal round and reactive, no palptable cervical adenopathy,  CV: RRR, no m/r/g no jvd Resp: Clear to auscultation bilaterally GI: abdomen is soft, non-tender, non-distended, normal bowel sounds, no hepatosplenomegaly MSK: extremities are warm, no edema.  Skin: warm, no rash Neuro:  no focal deficits Psych: appropriate affect     Assessment and Plan  1.Palpitations - EKG today shows NSR - we will paln for 14 day zio patch for further evaluation   F/u pending monitor results      Arnoldo Lenis, M.D.

## 2022-03-28 NOTE — Patient Instructions (Addendum)
Medication Instructions:  Your physician recommends that you continue on your current medications as directed. Please refer to the Current Medication list given to you today.   Labwork: None  Testing/Procedures: Zio Patch  Follow-Up: Follow up pending test results.   Any Other Special Instructions Will Be Listed Below (If Applicable).     If you need a refill on your cardiac medications before your next appointment, please call your pharmacy.   ZIO XT- Long Term Monitor Instructions   Your physician has requested you wear your ZIO patch monitor___14____days.   This is a single patch monitor.  [employer] supplies one patch monitor per enrollment.  Additional stickers are not available.   Please do not apply patch if you will be having a Nuclear Stress Test, Echocardiogram, Cardiac CT, MRI, or Chest Xray during the time frame you would be wearing the monitor. The patch cannot be worn during these tests.  You cannot remove and re-apply the ZIO XT patch monitor.   Your ZIO patch monitor will be sent USPS Priority mail from [facility] directly to your home address. The monitor may also be mailed to a PO BOX if home delivery is not available.   It may take 3-5 days to receive your monitor after you have been enrolled.   Once you have received you monitor, please review enclosed instructions.  Your monitor has already been registered assigning a specific monitor serial # to you.   Applying the monitor   Shave hair from upper left chest.   Hold abrader disc by orange tab.  Rub abrader in 40 strokes over left upper chest as indicated in your monitor instructions.   Clean area with 4 enclosed alcohol pads .  Use all pads to assure are is cleaned thoroughly.  Let dry.   Apply patch as indicated in monitor instructions.  Patch will be place under collarbone on left side of chest with arrow pointing upward.   Rub patch adhesive wings for 2 minutes.Remove white label marked "1".   Remove white label marked "2".  Rub patch adhesive wings for 2 additional minutes.   While looking in a mirror, press and release button in center of patch.  A small green light will flash 3-4 times .  This will be your only indicator the monitor has been turned on.     Do not shower for the first 24 hours.  You may shower after the first 24 hours.   Press button if you feel a symptom. You will hear a small click.  Record Date, Time and Symptom in the Patient Log Book.   When you are ready to remove patch, follow instructions on last 2 pages of Patient Log Book.  Stick patch monitor onto last page of Patient Log Book.   Place Patient Log Book in [place] box.  Use locking tab on box and tape box closed securely.  The [employer] and [employer] has [employer] on it.  Please place in mailbox as soon as possible.  Your physician should have your test results approximately 7 days after the monitor has been mailed back to [facility].   Call [facility] at [phone] if you have questions regarding your ZIO XT patch monitor.  Call them immediately if you see an orange light blinking on your monitor.   If your monitor falls off in less than 4 days contact our Monitor department at [phone].  If your monitor becomes loose or falls off after 4 days call  [employer] at [phone] for suggestions on securing your monitor.

## 2022-04-08 DIAGNOSIS — R002 Palpitations: Secondary | ICD-10-CM | POA: Diagnosis present

## 2022-04-09 ENCOUNTER — Emergency Department (HOSPITAL_COMMUNITY)
Admission: EM | Admit: 2022-04-09 | Discharge: 2022-04-09 | Disposition: A | Discharge status: Home / Self Care | Payer: BC Managed Care – PPO | Attending: Emergency Medicine | Admitting: Emergency Medicine

## 2022-04-09 ENCOUNTER — Emergency Department (HOSPITAL_COMMUNITY): Payer: BC Managed Care – PPO

## 2022-04-09 ENCOUNTER — Encounter (HOSPITAL_COMMUNITY): Payer: Self-pay | Admitting: Emergency Medicine

## 2022-04-09 DIAGNOSIS — R002 Palpitations: Secondary | ICD-10-CM

## 2022-04-09 LAB — CBC
HCT: 39.5 % (ref 36.0–46.0)
Hemoglobin: 13.2 g/dL (ref 12.0–15.0)
MCH: 29 pg (ref 26.0–34.0)
MCHC: 33.4 g/dL (ref 30.0–36.0)
MCV: 86.8 fL (ref 80.0–100.0)
Platelets: 289 10*3/uL (ref 150–400)
RBC: 4.55 MIL/uL (ref 3.87–5.11)
RDW: 12.6 % (ref 11.5–15.5)
WBC: 10.6 10*3/uL — ABNORMAL HIGH (ref 4.0–10.5)
nRBC: 0 % (ref 0.0–0.2)

## 2022-04-09 LAB — BASIC METABOLIC PANEL
Anion gap: 9 (ref 5–15)
BUN: 15 mg/dL (ref 6–20)
CO2: 22 mmol/L (ref 22–32)
Calcium: 9.4 mg/dL (ref 8.9–10.3)
Chloride: 106 mmol/L (ref 98–111)
Creatinine, Ser: 0.81 mg/dL (ref 0.44–1.00)
GFR, Estimated: >60 mL/min (ref >60)
Glucose, Bld: 177 mg/dL — ABNORMAL HIGH (ref 70–99)
Potassium: 4.5 mmol/L (ref 3.5–5.1)
Sodium: 137 mmol/L (ref 135–145)

## 2022-04-09 LAB — HCG, QUANTITATIVE, PREGNANCY: hCG, Beta Chain, Quant, S: <1 m[IU]/mL (ref <5)

## 2022-04-09 LAB — D-DIMER, QUANTITATIVE: D-Dimer, Quant: 0.32 ug/mL-FEU (ref 0.00–0.50)

## 2022-04-09 LAB — TROPONIN I (HIGH SENSITIVITY): Troponin I (High Sensitivity): <2 ng/L (ref <18)

## 2022-04-09 MED ORDER — SODIUM CHLORIDE 0.9 % IV BOLUS
1000.0000 mL | Freq: Once | INTRAVENOUS | Status: AC
  Administered 2022-04-09: 1000 mL via INTRAVENOUS

## 2022-04-09 NOTE — Discharge Instructions (Signed)
You were evaluated in the Emergency Department and after careful evaluation, we did not find any emergent condition requiring admission or further testing in the hospital.  Your exam/testing today was overall reassuring.  Recommend continued follow-up with cardiology to discuss her symptoms.  Please return to the Emergency Department if you experience any worsening of your condition.  Thank you for allowing [place] to be a part of your care.

## 2022-04-09 NOTE — ED Triage Notes (Signed)
Pt arrives POV from home c/o had funny feeling at home tonight and then she became short of breath and felt like she was going to pass out. Pt states she has been having similar episodes when she feels like her heart is racing.

## 2022-04-09 NOTE — ED Provider Notes (Signed)
[facility] Emergency Department Provider Note MRN:  [id number]  Arrival date & time: 04/09/22     Chief Complaint   Palpitations   History of Present Illness   Tamara Martin is a 46 y.o. year-old female with no pertinent past medical history presenting to the ED with chief complaint of palpitations.  Frequent palpitations for the past several days.  Also has some intermittent tremulous activity when she is waking up from sleep.  Feels lightheaded when standing.  Review of Systems  A thorough review of systems was obtained and all systems are negative except as noted in the HPI and PMH.   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   Headache, menstrual migraine     Past Surgical History:  Procedure Laterality Date   COLONOSCOPY  06/19/2005   COLONOSCOPY N/A 08/10/2015   Procedure: COLONOSCOPY;  Surgeon: Rogene Houston, MD;  [facility];  Service: Endoscopy;  Laterality: N/A;  1:25   EYE SURGERY     benign tumor   MASS EXCISION Left 10/27/2015   Procedure: EXCISION MASS LEFT BROW ( EXCISION OF SUBMUSCULAR MASS LEFT BROW 2.5CM);  Surgeon: Irene Limbo, MD;  Location: [place];  Service: Plastics;  Laterality: Left;    Family History  Problem Relation Age of Onset   Diabetes Maternal Grandmother    Hypertension Maternal Grandmother    Kidney failure Maternal Grandmother    Diabetes Maternal Grandfather    Cirrhosis Father    Cancer Father        stomach and bone   Atrial fibrillation Mother    Cancer Maternal Aunt        lung   Cancer Maternal Uncle        bones    Social History   Socioeconomic History   Marital status: Married    Spouse name: Not on file   Number of children: Not on file   Years of education: Not  on file   Highest education level: Not on file  Occupational History   Not on file  Tobacco Use   Smoking status: Never    Passive exposure: Never   Smokeless tobacco: Never  Vaping Use   Vaping Use: Never used  Substance and Sexual Activity   Alcohol use: No    Alcohol/week: 0.0 standard drinks of alcohol   Drug use: No   Sexual activity: Yes    Birth control/protection: Other-see comments    Comment: vasectomy  Other Topics Concern   Not on file  Social History Narrative   Not on file   Social Determinants of Health   Financial Resource Strain: Low Risk  (04/11/2020)   Overall Financial Resource Strain (CARDIA)    Difficulty of Paying Living Expenses: Not hard at all  Food Insecurity: No Food Insecurity (04/11/2020)   Hunger Vital Sign    Worried About Running Out of Food in the Last Year: Never true    Ran Out of Food in the Last Year: Never true  Transportation Needs: No Transportation Needs (04/11/2020)   PRAPARE - Hydrologist (Medical): No    Lack of Transportation (Non-Medical): No  Physical Activity: Inactive (04/11/2020)   Exercise Vital Sign  Days of Exercise per Week: 0 days    Minutes of Exercise per Session: 0 min  Stress: No Stress Concern Present (04/11/2020)   [place]    Feeling of Stress : Not at all  Social Connections: [place] (04/11/2020)   Social Connection and Isolation Panel [NHANES]    Frequency of Communication with Friends and Family: More than three times a week    Frequency of Social Gatherings with Friends and Family: Twice a week    Attends Religious Services: More than 4 times per year    Active Member of [employer] or Organizations: Yes    Attends Music therapist: More than 4 times per year    Marital Status: Married  Human resources officer Violence: Not At Risk (04/11/2020)   Humiliation, Afraid, Rape, and Kick questionnaire     Fear of Current or Ex-Partner: No    Emotionally Abused: No    Physically Abused: No    Sexually Abused: No     Physical Exam   Vitals:   04/09/22 0100 04/09/22 0130  BP: 118/63 114/69  Pulse: 92 84  Resp: 16 17  Temp:    SpO2: 96% 96%    CONSTITUTIONAL: Well-appearing, NAD NEURO/PSYCH:  Alert and oriented x 3, no focal deficits EYES:  eyes equal and reactive ENT/NECK:  no LAD, no JVD CARDIO: Regular rate, well-perfused, normal S1 and S2 PULM:  CTAB no wheezing or rhonchi GI/GU:  non-distended, non-tender MSK/SPINE:  No gross deformities, no edema SKIN:  no rash, atraumatic   *Additional and/or pertinent findings included in MDM below  Diagnostic and Interventional Summary    EKG Interpretation  Date/Time:  Tuesday April 09 2022 00:37:11 EDT Ventricular Rate:  104 PR Interval:  137 QRS Duration: 86 QT Interval:  349 QTC Calculation: 459 R Axis:   31 Text Interpretation: Sinus tachycardia Low voltage, precordial leads Baseline wander in lead(s) V6 Confirmed by Gerlene Fee [phone]) on 04/09/2022 1:08:23 AM       Labs Reviewed  CBC - Abnormal; Notable for the following components:      Result Value   WBC 10.6 (*)    All other components within normal limits  BASIC METABOLIC PANEL - Abnormal; Notable for the following components:   Glucose, Bld 177 (*)    All other components within normal limits  HCG, QUANTITATIVE, PREGNANCY  D-DIMER, QUANTITATIVE  TROPONIN I (HIGH SENSITIVITY)  TROPONIN I (HIGH SENSITIVITY)    DG Chest Portable 1 View  Final Result      Medications  sodium chloride 0.9 % bolus 1,000 mL (1,000 mLs Intravenous New Bag/Given 04/09/22 0252)     Procedures  /  Critical Care Procedures  ED Course and Medical Decision Making  Initial Impression and Ddx Recent dyspnea on exertion, lightheadedness, palpitations.  Being followed by cardiology with a 30-day monitor.  TSH few days ago was normal.  Other considerations include arrhythmia,  electrolyte disturbance, PE is also a consideration.  Awaiting labs, D-dimer.  Past medical/surgical history that increases complexity of ED encounter: None  Interpretation of Diagnostics I personally reviewed the EKG and my interpretation is as follows: Sinus rhythm  Labs reassuring with no significant blood count or electrolyte disturbance, troponin negative, D-dimer negative.  Chest x-ray normal.  Patient Reassessment and Ultimate Disposition/Management     Patient continues to look and feel well, no ectopy or arrhythmia on cardiac monitor for several hours, appropriate for discharge with cardiology follow-up.  Patient  management required discussion with the following services or consulting groups:  None  Complexity of Problems Addressed Acute illness or injury that poses threat of life of bodily function  Additional Data Reviewed and Analyzed Further history obtained from: None  Additional Factors Impacting ED Encounter Risk None  Barth Kirks. [place] Small, [place] [email or web address]  Final Clinical Impressions(s) / ED Diagnoses     ICD-10-CM   1. Palpitations  R00.2       ED Discharge Orders     None        Discharge Instructions Discussed with and Provided to Patient:     Discharge Instructions      You were evaluated in the Emergency Department and after careful evaluation, we did not find any emergent condition requiring admission or further testing in the hospital.  Your exam/testing today was overall reassuring.  Recommend continued follow-up with cardiology to discuss her symptoms.  Please return to the Emergency Department if you experience any worsening of your condition.  Thank you for allowing [place] to be a part of your care.        Maudie Flakes, MD 04/09/22 [phone]

## 2022-04-10 ENCOUNTER — Ambulatory Visit: Payer: BC Managed Care – PPO | Admitting: Adult Health

## 2022-04-10 ENCOUNTER — Encounter: Payer: Self-pay | Admitting: Adult Health

## 2022-04-10 VITALS — BP 128/76 | HR 84 | Ht 65.0 in | Wt 157.5 lb

## 2022-04-10 DIAGNOSIS — Z131 Encounter for screening for diabetes mellitus: Secondary | ICD-10-CM

## 2022-04-10 DIAGNOSIS — F419 Anxiety disorder, unspecified: Secondary | ICD-10-CM | POA: Diagnosis not present

## 2022-04-10 NOTE — Progress Notes (Signed)
  Subjective:     Patient ID: Tamara Martin, female   DOB: April 23, 1976, 46 y.o.   MRN: [id number]  HPI [name] is a 46 year old white female, married, [id number] back in follow up after starting Buspar and vistaril for anxiety. Feels about the same, scalp not itching. She has heart monitor own now and was seen in ER yesterday for felt shaky, and short of breath with nausea. Head still does not feel quite right.  Last pap was 04/11/20 was negative for malignancy and HPV.   PCP is Dr Gerarda Fraction.   Review of Systems +anxiety Felt shaky seen in ER Head no longer itches, but not right Reviewed past medical,surgical, social and family history. Reviewed medications and allergies.     Objective:   Physical Exam BP 128/76 (BP Location: Left Arm, Patient Position: Sitting, Cuff Size: Normal)   Pulse 84   Ht 5' 5"  (1.651 m)   Wt 157 lb 8 oz (71.4 kg)   LMP 03/17/2022   BMI 26.21 kg/m     Skin warm and dry. Lungs: clear to ausculation bilaterally. Cardiovascular: regular rate and rhythm.     04/10/2022    4:09 PM 03/20/2022    3:29 PM 04/11/2020    1:54 PM  Depression screen PHQ 2/9  Decreased Interest 0 0 0  Down, Depressed, Hopeless 1 1 0  PHQ - 2 Score 1 1 0  Altered sleeping 1 1 0  Tired, decreased energy 1 1 0  Change in appetite 0 0 0  Feeling bad or failure about yourself  0  0  Trouble concentrating 0 0 0  Moving slowly or fidgety/restless 0 0 0  Suicidal thoughts 0 0 0  PHQ-9 Score 3 3 0  Difficult doing work/chores  Not difficult at all Not difficult at all       04/10/2022    4:11 PM 03/20/2022    3:31 PM 04/11/2020    1:54 PM  GAD 7 : Generalized Anxiety Score  Nervous, Anxious, on Edge 2 2 0  Control/stop worrying 1 1 0  Worry too much - different things 1 1 0  Trouble relaxing 1 1 0  Restless 0 0 0  Easily annoyed or irritable 0 0 0  Afraid - awful might happen 1 1 0  Total GAD 7 Score 6 6 0  Anxiety Difficulty Not difficult at all  Not difficult at all       Upstream - 04/10/22 1609       Pregnancy Intention Screening   Does the patient want to become pregnant in the next year? No    Does the patient's partner want to become pregnant in the next year? No    Would the patient like to discuss contraceptive options today? No      Contraception Wrap Up   Current Method Vasectomy    End Method Vasectomy             Assessment:     1. Anxiety Continue buspar and vistaril, has refills  Will follow up in 4 weeks for ROS  2. Screening for diabetes mellitus Will check A1c to rule out DM, blood sugar was 177 in ER Felt shaky - Hemoglobin A1c     Plan:     See Dr Gerarda Fraction if head continues to not feel right

## 2022-04-11 LAB — HEMOGLOBIN A1C
Est. average glucose Bld gHb Est-mCnc: 128 mg/dL
Hgb A1c MFr Bld: 6.1 % — ABNORMAL HIGH (ref 4.8–5.6)

## 2022-04-16 ENCOUNTER — Encounter (INDEPENDENT_AMBULATORY_CARE_PROVIDER_SITE_OTHER): Payer: Self-pay | Admitting: Gastroenterology

## 2022-04-16 ENCOUNTER — Encounter (INDEPENDENT_AMBULATORY_CARE_PROVIDER_SITE_OTHER): Payer: Self-pay

## 2022-04-16 ENCOUNTER — Ambulatory Visit (INDEPENDENT_AMBULATORY_CARE_PROVIDER_SITE_OTHER): Payer: BC Managed Care – PPO | Admitting: Gastroenterology

## 2022-04-16 ENCOUNTER — Other Ambulatory Visit (INDEPENDENT_AMBULATORY_CARE_PROVIDER_SITE_OTHER): Payer: Self-pay

## 2022-04-16 VITALS — BP 120/84 | HR 78 | Temp 98.1°F | Ht 65.0 in | Wt 156.1 lb

## 2022-04-16 DIAGNOSIS — R1013 Epigastric pain: Secondary | ICD-10-CM

## 2022-04-16 DIAGNOSIS — R6881 Early satiety: Secondary | ICD-10-CM

## 2022-04-16 DIAGNOSIS — R1010 Upper abdominal pain, unspecified: Secondary | ICD-10-CM

## 2022-04-16 DIAGNOSIS — K5080 Crohn's disease of both small and large intestine without complications: Secondary | ICD-10-CM | POA: Diagnosis not present

## 2022-04-16 DIAGNOSIS — Z01812 Encounter for preprocedural laboratory examination: Secondary | ICD-10-CM

## 2022-04-16 DIAGNOSIS — Z8619 Personal history of other infectious and parasitic diseases: Secondary | ICD-10-CM | POA: Diagnosis not present

## 2022-04-16 MED ORDER — PANTOPRAZOLE SODIUM 40 MG PO TBEC: 40.0000 mg | DELAYED_RELEASE_TABLET | Freq: Every day | ORAL | 1 refills | Status: DC

## 2022-04-16 NOTE — Patient Instructions (Signed)
We will get you scheduled for EGD for further evaluation of your upper GI symptoms as well as update colonoscopy since last was in 2017 I am starting you on protonix 27m daily as this may help your symptoms but also acts as a protective agent if there is presence of an ulcer. Please continue to avoid all NSAID medications   Follow up 6 months

## 2022-04-16 NOTE — Progress Notes (Unsigned)
Referring Provider: Redmond School, MD Primary Care Physician:  Redmond School, MD Primary GI Physician: Previously Rehman  Chief Complaint  Patient presents with   Crohn's Disease    6 month follow up on Crohn's. Has concerns about gas.    HPI:   Tamara Martin is a 46 y.o. female with past medical history of Crohn's disease, anxiety, RA.   Patient presenting today for follow up of Crohn's disease.   History:  26-year history of ileal and rectal Crohn disease. Colonoscopy in February 2017 revealed normal terminal ileum and colonoscopy except mild distal proctitis and she was treated with Canasa suppositories.  She had been maintained on oral mesalamine which was subsequently discontinued in August 2022 when she was begun on Humira/adalimumab for arthritis and she is being followed in rheumatology clinic in [place].   Last seen April 2023, at that time having some constipation and taking miralax for this, having 1 formed stool per day without abdominal pain.   Recommended to have updated colonoscopy, H pylori labs to confirm eradication from previous infection, CBC w diff, CMP and CRP checked.   She was lost to follow up for colonoscopy. CRP, CBC and CMP all WNL. H pylori stool antigen negative. Last labs in September with normal CMP, CBC, and thyroid testing.  Notably, patient did contact our office in July with c/o belching, post prandial abdominal pain, and chest discomfort, she was started on omeprazole 63m daily and advised to follow up for further evaluation.   Present:  Patient states that she had pain previously around her bra line, had work up to rule out lung etiology, states PCP did testing and she was told she had an ulcer? H pylori, she was treated, and h pylori eradicated as above. She notes that in July she began having similar pain around her bra line with belching. She denies nausea or vomiting. Feels that appetite has decreased some recently but has been  trying to eat better as she notes that her a1c was recently elevated. She states that in the middle of eating she may develop some abdominal pain, and some early satiety. Usually she can stop eating and this will pass, this is occurring 2-3x/week. She states that she did start PPi that was sent in July but did not notice any results. Denies black stools. Denies NSAID.   She is maintained on Humira 458mq2 weeks. She is having a BM maybe every other day. Denies rectal bleeding. Since she changed her she has noticed an easier time having a BM. She was previously on fiber gummy but recently switched to metamucil pill, is taking 2-4 pills per day.   Extraintestinal Manifestations: Skin:  no rashes or lesions  Joints:no recent issues  Eyes: has eye doctor appt coming up, recently with blurred vision up close   Flu vaccine: unsure of last one Covid vaccines: never  Pap smear: upcoming at the end of the month   Last Colonoscopy:2017 Normal mucosa of terminal ileum. Normal colonoscopy except mild distal proctitis. Small external hemorrhoids. Last Endoscopy: never  Recommendations:    Past Medical History:  Diagnosis Date   Anxiety 03/04/2013   Arthritis    rheumatoid   [place]   Dermoid cyst of eyebrow 10/2015   left   Fibroadenoma of right breast 02/27/2017   Headache, menstrual migraine     Past Surgical History:  Procedure Laterality Date   COLONOSCOPY  06/19/2005   COLONOSCOPY N/A 08/10/2015  Procedure: COLONOSCOPY;  Surgeon: Rogene Houston, MD;  [facility];  Service: Endoscopy;  Laterality: N/A;  1:25   EYE SURGERY     benign tumor   MASS EXCISION Left 10/27/2015   Procedure: EXCISION MASS LEFT BROW ( EXCISION OF SUBMUSCULAR MASS LEFT BROW 2.5CM);  Surgeon: Irene Limbo, MD;  Location: [place];  Service: Plastics;  Laterality: Left;    Current Outpatient Medications  Medication Sig Dispense Refill   Adalimumab 40  MG/0.8ML PNKT Inject into the skin every 14 (fourteen) days.      b complex vitamins capsule Take 1 capsule by mouth daily.     busPIRone (BUSPAR) 5 MG tablet Take 1 tablet (5 mg total) by mouth 2 (two) times daily. 60 tablet 3   cyclobenzaprine (FLEXERIL) 10 MG tablet Take 1 tablet (10 mg total) by mouth every 8 (eight) hours as needed for muscle spasms. 30 tablet 1   FIBER SELECT GUMMIES PO Take 1 tablet by mouth daily.     hydrOXYzine (ATARAX) 10 MG tablet Take 1 tablet (10 mg total) by mouth 3 (three) times daily as needed. 30 tablet 3   montelukast (SINGULAIR) 10 MG tablet Take 10 mg by mouth daily.     Multiple Vitamins-Minerals (MULTI ADULT GUMMIES PO) Take by mouth daily.     polyethylene glycol (MIRALAX / GLYCOLAX) 17 g packet Take 17 g by mouth as needed.     Probiotic Product (PROBIOTIC PO) Take by mouth daily.     No current facility-administered medications for this visit.    Allergies as of 04/16/2022 - Review Complete 04/16/2022  Allergen Reaction Noted   Penicillins Hives 05/03/2011    Family History  Problem Relation Age of Onset   Diabetes Maternal Grandmother    Hypertension Maternal Grandmother    Kidney failure Maternal Grandmother    Diabetes Maternal Grandfather    Cirrhosis Father    Cancer Father        stomach and bone   Atrial fibrillation Mother    Cancer Maternal Aunt        lung   Cancer Maternal Uncle        bones    Social History   Socioeconomic History   Marital status: Married    Spouse name: Not on file   Number of children: Not on file   Years of education: Not on file   Highest education level: Not on file  Occupational History   Not on file  Tobacco Use   Smoking status: Never    Passive exposure: Never   Smokeless tobacco: Never  Vaping Use   Vaping Use: Never used  Substance and Sexual Activity   Alcohol use: No    Alcohol/week: 0.0 standard drinks of alcohol   Drug use: No   Sexual activity: Yes    Birth  control/protection: Other-see comments    Comment: vasectomy  Other Topics Concern   Not on file  Social History Narrative   Not on file   Social Determinants of Health   Financial Resource Strain: Low Risk  (04/11/2020)   Overall Financial Resource Strain (CARDIA)    Difficulty of Paying Living Expenses: Not hard at all  Food Insecurity: No Food Insecurity (04/11/2020)   Hunger Vital Sign    Worried About Running Out of Food in the Last Year: Never true    Ran Out of Food in the Last Year: Never true  Transportation Needs: No Transportation Needs (04/11/2020)  PRAPARE - Hydrologist (Medical): No    Lack of Transportation (Non-Medical): No  Physical Activity: Inactive (04/11/2020)   Exercise Vital Sign    Days of Exercise per Week: 0 days    Minutes of Exercise per Session: 0 min  Stress: No Stress Concern Present (04/11/2020)   [place]    Feeling of Stress : Not at all  Social Connections: [place] (04/11/2020)   Social Connection and Isolation Panel [NHANES]    Frequency of Communication with Friends and Family: More than three times a week    Frequency of Social Gatherings with Friends and Family: Twice a week    Attends Religious Services: More than 4 times per year    Active Member of [employer] or Organizations: Yes    Attends Music therapist: More than 4 times per year    Marital Status: Married   Review of systems General: negative for malaise, night sweats, fever, chills, weight los Neck: Negative for lumps, goiter, pain and significant neck swelling Resp: Negative for cough, wheezing, dyspnea at rest CV: Negative for chest pain, leg swelling, palpitations, orthopnea GI: denies melena, hematochezia, nausea, vomiting, diarrhea, constipation, dysphagia, odyonophagia, early satiety or unintentional weight loss.  MSK: Negative for joint pain or swelling, back  pain, and muscle pain. Derm: Negative for itching or rash Psych: Denies depression, anxiety, memory loss, confusion. No homicidal or suicidal ideation.  Heme: Negative for prolonged bleeding, bruising easily, and swollen nodes. Endocrine: Negative for cold or heat intolerance, polyuria, polydipsia and goiter. Neuro: negative for tremor, gait imbalance, syncope and seizures. The remainder of the review of systems is noncontributory.  Physical Exam: LMP 03/17/2022  General:   Alert and oriented. No distress noted. Pleasant and cooperative.  Head:  Normocephalic and atraumatic. Eyes:  Conjuctiva clear without scleral icterus. Mouth:  Oral mucosa pink and moist. Good dentition. No lesions. Heart: Normal rate and rhythm, s1 and s2 heart sounds present.  Lungs: Clear lung sounds in all lobes. Respirations equal and unlabored. Abdomen:  +BS, soft, non-tender and non-distended. No rebound or guarding. No HSM or masses noted. Derm: No palmar erythema or jaundice Msk:  Symmetrical without gross deformities. Normal posture. Extremities:  Without edema. Neurologic:  Alert and  oriented x4 Psych:  Alert and cooperative. Normal mood and affect.  Invalid input(s): "6 MONTHS"   ASSESSMENT: Tamara Martin is a 46 y.o. female presenting today for follow up of Crohn's disease and for belching and upper abdominal discomfort.    PLAN:  Continue Humira at current dose  2. Schedule Colonoscopy and EGD-ASA II, ENDO 1 3. Rx protonix 55m daily  4. Continue to avoid all NSAIDs  All questions were answered, patient verbalized understanding and is in agreement with plan as outlined above.    Follow Up: 6 months   Trameka Dorough L. CAlver Sorrow MSN, APRN, AGNP-C Adult-Gerontology Nurse Practitioner [facility]

## 2022-04-17 DIAGNOSIS — R1013 Epigastric pain: Secondary | ICD-10-CM | POA: Insufficient documentation

## 2022-04-17 DIAGNOSIS — R1010 Upper abdominal pain, unspecified: Secondary | ICD-10-CM | POA: Insufficient documentation

## 2022-04-17 DIAGNOSIS — R6881 Early satiety: Secondary | ICD-10-CM | POA: Insufficient documentation

## 2022-04-30 ENCOUNTER — Ambulatory Visit: Payer: BC Managed Care – PPO | Admitting: Internal Medicine

## 2022-05-01 ENCOUNTER — Ambulatory Visit (INDEPENDENT_AMBULATORY_CARE_PROVIDER_SITE_OTHER): Payer: BC Managed Care – PPO | Admitting: Adult Health

## 2022-05-01 ENCOUNTER — Encounter: Payer: Self-pay | Admitting: Adult Health

## 2022-05-01 VITALS — BP 119/77 | HR 89 | Ht 66.0 in | Wt 157.5 lb

## 2022-05-01 DIAGNOSIS — F419 Anxiety disorder, unspecified: Secondary | ICD-10-CM

## 2022-05-01 DIAGNOSIS — R1031 Right lower quadrant pain: Secondary | ICD-10-CM | POA: Diagnosis not present

## 2022-05-01 DIAGNOSIS — Z01419 Encounter for gynecological examination (general) (routine) without abnormal findings: Secondary | ICD-10-CM | POA: Diagnosis not present

## 2022-05-01 DIAGNOSIS — Z0000 Encounter for general adult medical examination without abnormal findings: Secondary | ICD-10-CM | POA: Insufficient documentation

## 2022-05-01 MED ORDER — BUSPIRONE HCL 5 MG PO TABS: 5.0000 mg | ORAL_TABLET | Freq: Two times a day (BID) | ORAL | 12 refills | Status: DC

## 2022-05-01 NOTE — Progress Notes (Signed)
Patient ID: Tamara Martin, female   DOB: 04/06/1976, 46 y.o.   MRN: [id number] History of Present Illness: Tamara Martin is a 47 year old white female,married, [id number] in for a well woman gyn exam. No pain in RLQ now and anxiety is much better with Buspar.  Last pap was negative HPV and malignancy 04/11/20.  PCP is Dr Gerarda Fraction.   Current Medications, Allergies, Past Medical History, Past Surgical History, Family History and Social History were reviewed in [place] record.     Review of Systems: Patient denies any headaches, hearing loss, fatigue, blurred vision, shortness of breath, chest pain, abdominal pain, problems with bowel movements, urination, or intercourse. No joint pain or mood swings.     Physical Exam:BP 119/77 (BP Location: Left Arm, Patient Position: Sitting, Cuff Size: Normal)   Pulse 89   Ht 5' 6"  (1.676 m)   Wt 157 lb 8 oz (71.4 kg)   LMP 04/12/2022   BMI 25.42 kg/m   General:  Well developed, well nourished, no acute distress Skin:  Warm and dry Neck:  Midline trachea, normal thyroid, good ROM, no lymphadenopathy Lungs; Clear to auscultation bilaterally Breast:  No dominant palpable mass, retraction, or nipple discharge Cardiovascular: Regular rate and rhythm Abdomen:  Soft, non tender, no hepatosplenomegaly Pelvic:  External genitalia is normal in appearance, no lesions.  The vagina is normal in appearance. Urethra has no lesions or masses. The cervix is bulbous.  Uterus is felt to be normal size, shape, and contour.  No adnexal masses or tenderness noted.Bladder is non tender, no masses felt. Rectal: Deferred  Extremities/musculoskeletal:  No swelling or varicosities noted, no clubbing or cyanosis Psych:  No mood changes, alert and cooperative,seems happy AA is 0    05/01/2022    4:00 PM 04/10/2022    4:09 PM 03/20/2022    3:29 PM  Depression screen PHQ 2/9  Decreased Interest 0 0 0  Down, Depressed, Hopeless 1 1 1   PHQ - 2 Score 1 1 1    Altered sleeping 0 1 1  Tired, decreased energy 0 1 1  Change in appetite 0 0 0  Feeling bad or failure about yourself  0 0   Trouble concentrating 0 0 0  Moving slowly or fidgety/restless 0 0 0  Suicidal thoughts 0 0 0  PHQ-9 Score 1 3 3   Difficult doing work/chores   Not difficult at all       05/01/2022    4:01 PM 04/10/2022    4:11 PM 03/20/2022    3:31 PM 04/11/2020    1:54 PM  GAD 7 : Generalized Anxiety Score  Nervous, Anxious, on Edge 1 2 2  0  Control/stop worrying 1 1 1  0  Worry too much - different things 1 1 1  0  Trouble relaxing 1 1 1  0  Restless 0 0 0 0  Easily annoyed or irritable 0 0 0 0  Afraid - awful might happen 0 1 1 0  Total GAD 7 Score 4 6 6  0  Anxiety Difficulty  Not difficult at all  Not difficult at all      Upstream - 05/01/22 1606       Pregnancy Intention Screening   Does the patient want to become pregnant in the next year? No    Does the patient's partner want to become pregnant in the next year? No    Would the patient like to discuss contraceptive options today? No      Contraception Wrap Up  Current Method Vasectomy    End Method Vasectomy    Contraception Counseling Provided No             Examination chaperoned by Levy Pupa LPN  Impression and Plan: 1. Encounter for well woman exam with routine gynecological exam Pap and physical in 1 year Mammogram was 04/18/21,due now Having colonoscopy and EGD 05/24/22   2. Anxiety Much better on Buspar, will refill Meds ordered this encounter  Medications   busPIRone (BUSPAR) 5 MG tablet    Sig: Take 1 tablet (5 mg total) by mouth 2 (two) times daily.    Dispense:  60 tablet    Refill:  12    Order Specific Question:   Supervising Provider    Answer:   Elonda Husky, LUTHER H [[id number]]     3. RLQ abdominal pain Has resolved

## 2022-05-08 ENCOUNTER — Ambulatory Visit: Payer: BC Managed Care – PPO | Admitting: Adult Health

## 2022-05-22 ENCOUNTER — Other Ambulatory Visit (HOSPITAL_COMMUNITY)
Admission: RE | Admit: 2022-05-22 | Discharge: 2022-05-22 | Disposition: A | Discharge status: Home / Self Care | Payer: BC Managed Care – PPO | Source: Ambulatory Visit | Attending: Gastroenterology | Admitting: Gastroenterology

## 2022-05-22 DIAGNOSIS — R1013 Epigastric pain: Secondary | ICD-10-CM | POA: Diagnosis not present

## 2022-05-22 DIAGNOSIS — Z01812 Encounter for preprocedural laboratory examination: Secondary | ICD-10-CM | POA: Insufficient documentation

## 2022-05-22 DIAGNOSIS — K5080 Crohn's disease of both small and large intestine without complications: Secondary | ICD-10-CM | POA: Diagnosis present

## 2022-05-22 DIAGNOSIS — F419 Anxiety disorder, unspecified: Secondary | ICD-10-CM | POA: Diagnosis not present

## 2022-05-22 DIAGNOSIS — K648 Other hemorrhoids: Secondary | ICD-10-CM | POA: Diagnosis not present

## 2022-05-22 DIAGNOSIS — Z79899 Other long term (current) drug therapy: Secondary | ICD-10-CM | POA: Diagnosis not present

## 2022-05-22 DIAGNOSIS — K449 Diaphragmatic hernia without obstruction or gangrene: Secondary | ICD-10-CM | POA: Diagnosis not present

## 2022-05-22 DIAGNOSIS — K317 Polyp of stomach and duodenum: Secondary | ICD-10-CM | POA: Diagnosis not present

## 2022-05-22 DIAGNOSIS — K2950 Unspecified chronic gastritis without bleeding: Secondary | ICD-10-CM | POA: Diagnosis not present

## 2022-05-22 LAB — PREGNANCY, URINE: Preg Test, Ur: NEGATIVE

## 2022-05-24 ENCOUNTER — Ambulatory Visit (HOSPITAL_COMMUNITY)
Admission: RE | Admit: 2022-05-24 | Discharge: 2022-05-24 | Disposition: A | Discharge status: Home / Self Care | Payer: BC Managed Care – PPO | Source: Ambulatory Visit | Attending: Gastroenterology | Admitting: Gastroenterology

## 2022-05-24 ENCOUNTER — Ambulatory Visit (HOSPITAL_COMMUNITY): Payer: BC Managed Care – PPO | Admitting: Anesthesiology

## 2022-05-24 ENCOUNTER — Other Ambulatory Visit: Payer: Self-pay

## 2022-05-24 ENCOUNTER — Encounter (HOSPITAL_COMMUNITY)
Admission: RE | Disposition: A | Discharge status: Home / Self Care | Payer: Self-pay | Source: Ambulatory Visit | Attending: Gastroenterology

## 2022-05-24 ENCOUNTER — Encounter (HOSPITAL_COMMUNITY): Payer: Self-pay | Admitting: Gastroenterology

## 2022-05-24 DIAGNOSIS — K317 Polyp of stomach and duodenum: Secondary | ICD-10-CM | POA: Insufficient documentation

## 2022-05-24 DIAGNOSIS — R1013 Epigastric pain: Secondary | ICD-10-CM | POA: Insufficient documentation

## 2022-05-24 DIAGNOSIS — K2950 Unspecified chronic gastritis without bleeding: Secondary | ICD-10-CM | POA: Insufficient documentation

## 2022-05-24 DIAGNOSIS — K449 Diaphragmatic hernia without obstruction or gangrene: Secondary | ICD-10-CM | POA: Insufficient documentation

## 2022-05-24 DIAGNOSIS — Z09 Encounter for follow-up examination after completed treatment for conditions other than malignant neoplasm: Secondary | ICD-10-CM

## 2022-05-24 DIAGNOSIS — K648 Other hemorrhoids: Secondary | ICD-10-CM | POA: Insufficient documentation

## 2022-05-24 DIAGNOSIS — Z79899 Other long term (current) drug therapy: Secondary | ICD-10-CM | POA: Insufficient documentation

## 2022-05-24 DIAGNOSIS — K5080 Crohn's disease of both small and large intestine without complications: Secondary | ICD-10-CM | POA: Insufficient documentation

## 2022-05-24 DIAGNOSIS — R6881 Early satiety: Secondary | ICD-10-CM

## 2022-05-24 DIAGNOSIS — F419 Anxiety disorder, unspecified: Secondary | ICD-10-CM | POA: Insufficient documentation

## 2022-05-24 HISTORY — PX: ESOPHAGOGASTRODUODENOSCOPY (EGD) WITH PROPOFOL: SHX5813

## 2022-05-24 HISTORY — PX: BIOPSY: SHX5522

## 2022-05-24 HISTORY — PX: COLONOSCOPY WITH PROPOFOL: SHX5780

## 2022-05-24 SURGERY — COLONOSCOPY WITH PROPOFOL
Anesthesia: General

## 2022-05-24 MED ORDER — PROPOFOL 500 MG/50ML IV EMUL
INTRAVENOUS | Status: AC
  Filled 2022-05-24: qty 50

## 2022-05-24 MED ORDER — PHENYLEPHRINE HCL (PRESSORS) 10 MG/ML IV SOLN
INTRAVENOUS | Status: DC | PRN
  Administered 2022-05-24 (×4): 80 ug via INTRAVENOUS

## 2022-05-24 MED ORDER — LIDOCAINE HCL (PF) 2 % IJ SOLN
INTRAMUSCULAR | Status: DC | PRN
  Administered 2022-05-24: 50 mg via INTRADERMAL

## 2022-05-24 MED ORDER — LIDOCAINE HCL (PF) 2 % IJ SOLN
INTRAMUSCULAR | Status: AC
  Filled 2022-05-24: qty 5

## 2022-05-24 MED ORDER — LACTATED RINGERS IV SOLN: INTRAVENOUS | Status: DC

## 2022-05-24 MED ORDER — PROPOFOL 10 MG/ML IV BOLUS
INTRAVENOUS | Status: DC | PRN
  Administered 2022-05-24: 100 mg via INTRAVENOUS

## 2022-05-24 MED ORDER — PROPOFOL 500 MG/50ML IV EMUL
INTRAVENOUS | Status: DC | PRN
  Administered 2022-05-24: 200 ug/kg/min via INTRAVENOUS

## 2022-05-24 NOTE — Transfer of Care (Signed)
Immediate Anesthesia Transfer of Care Note  Patient: Tamara Martin  Procedure(s) Performed: COLONOSCOPY WITH PROPOFOL ESOPHAGOGASTRODUODENOSCOPY (EGD) WITH PROPOFOL BIOPSY  Patient Location: PACU  Anesthesia Type:General  Level of Consciousness: awake, alert , and oriented  Airway & Oxygen Therapy: Patient Spontanous Breathing  Post-op Assessment: Report given to RN, Post -op Vital signs reviewed and stable, Patient moving all extremities X 4, and Patient able to stick tongue midline  Post vital signs: Reviewed  Last Vitals:  Vitals Value Taken Time  BP 105/54 05/24/22 1406  Temp 36.8 C 05/24/22 1406  Pulse 84 05/24/22 1406  Resp 18 05/24/22 1406  SpO2 100 % 05/24/22 1406    Last Pain:  Vitals:   05/24/22 1406  TempSrc: Oral  PainSc: 0-No pain      Patients Stated Pain Goal: 7 (94/32/76 1470)  Complications: No notable events documented.

## 2022-05-24 NOTE — H&P (Signed)
Tamara Martin is an 46 y.o. female.   Chief Complaint: Crohn's disease and epigastric abdominal pain. HPI: 46 year old female with past medical history of anxiety, arthritis, Crohn's disease of the rectum and ileum, coming for follow-up of Crohn's disease and epigastric abdominal pain.  Patient reports feeling well and denies any diarrhea, rectal bleeding.  She has some pain in the epigastric area and left breast area which has improved with the use of pantoprazole.  Denies any nausea, vomiting, fever, chills.  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   Headache, menstrual migraine     Past Surgical History:  Procedure Laterality Date   COLONOSCOPY  06/19/2005   COLONOSCOPY N/A 08/10/2015   Procedure: COLONOSCOPY;  Surgeon: Rogene Houston, MD;  [facility];  Service: Endoscopy;  Laterality: N/A;  1:25   EYE SURGERY     benign tumor   MASS EXCISION Left 10/27/2015   Procedure: EXCISION MASS LEFT BROW ( EXCISION OF SUBMUSCULAR MASS LEFT BROW 2.5CM);  Surgeon: Irene Limbo, MD;  Location: [place];  Service: Plastics;  Laterality: Left;    Family History  Problem Relation Age of Onset   Diabetes Maternal Grandmother    Hypertension Maternal Grandmother    Kidney failure Maternal Grandmother    Diabetes Maternal Grandfather    Cirrhosis Father    Cancer Father        stomach and bone   Atrial fibrillation Mother    Cancer Maternal Aunt        lung   Cancer Maternal Uncle        bones   Social History:  reports that she has never smoked. She has never been exposed to tobacco smoke. She has never used smokeless tobacco. She reports that she does not drink alcohol and does not use drugs.  Allergies:  Allergies  Allergen Reactions   Penicillins Hives         Medications Prior to  Admission  Medication Sig Dispense Refill   Adalimumab (HUMIRA) 40 MG/0.8ML PSKT Inject 40 mg into the skin every 14 (fourteen) days.     B Complex Vitamins (B COMPLEX PO) Place 1 drop under the tongue daily.     busPIRone (BUSPAR) 5 MG tablet Take 1 tablet (5 mg total) by mouth 2 (two) times daily. 60 tablet 12   CALCIUM PO Take 1 tablet by mouth daily.     FIBER SELECT GUMMIES PO Take 1 each by mouth daily.     montelukast (SINGULAIR) 10 MG tablet Take 10 mg by mouth at bedtime.     Multiple Vitamins-Minerals (MULTI ADULT GUMMIES PO) Take 1 each by mouth daily.     pantoprazole (PROTONIX) 40 MG tablet Take 1 tablet (40 mg total) by mouth daily. 30 tablet 1   polyethylene glycol (MIRALAX / GLYCOLAX) 17 g packet Take 17 g by mouth daily as needed for moderate constipation.     Probiotic Product (PROBIOTIC PO) Take 1 capsule by mouth daily.     cyclobenzaprine (FLEXERIL) 10 MG tablet Take 1 tablet (10 mg total) by mouth every 8 (eight) hours as needed for muscle spasms. 30 tablet 1   hydrOXYzine (ATARAX) 10 MG tablet Take 1 tablet (10 mg total) by mouth 3 (three) times daily as needed. 30 tablet  3    No results found for this or any previous visit (from the past 48 hour(s)). No results found.  Review of Systems  Gastrointestinal:  Positive for abdominal pain.  All other systems reviewed and are negative.   Last menstrual period 05/06/2022. Physical Exam  GENERAL: The patient is AO x3, in no acute distress. HEENT: Head is normocephalic and atraumatic. EOMI are intact. Mouth is well hydrated and without lesions. NECK: Supple. No masses LUNGS: Clear to auscultation. No presence of rhonchi/wheezing/rales. Adequate chest expansion HEART: RRR, normal s1 and s2. ABDOMEN: Soft, nontender, no guarding, no peritoneal signs, and nondistended. BS +. No masses. EXTREMITIES: Without any cyanosis, clubbing, rash, lesions or edema. NEUROLOGIC: AOx3, no focal motor deficit. SKIN: no jaundice, no  rashes  Assessment/Plan 46 year old female with past medical history of anxiety, arthritis, Crohn's disease of the rectum and ileum, coming for follow-up of Crohn's disease and epigastric abdominal pain.  We will proceed with EGD and colonoscopy.  Harvel Quale, MD 05/24/2022, 12:16 PM

## 2022-05-24 NOTE — Op Note (Signed)
[facility] Patient Name: Tamara Martin Procedure Date: 05/24/2022 1:22 PM MRN: [id number] Date of Birth: 06/22/1976 Attending MD: Maylon Peppers , , [id number] CSN: [id number] Age: 46 Admit Type: Outpatient Procedure:                Upper GI endoscopy Indications:              Epigastric abdominal pain Providers:                Maylon Peppers, Caprice Kluver, [place], Technician Referring MD:              Medicines:                Monitored Anesthesia Care Complications:            No immediate complications. Estimated Blood Loss:     Estimated blood loss: none. Procedure:                Pre-Anesthesia Assessment:                           - Prior to the procedure, a History and Physical                            was performed, and patient medications, allergies                            and sensitivities were reviewed. The patient's                            tolerance of previous anesthesia was reviewed.                           - The risks and benefits of the procedure and the                            sedation options and risks were discussed with the                            patient. All questions were answered and informed                            consent was obtained.                           - ASA Grade Assessment: II - A patient with mild                            systemic disease.                           After obtaining informed consent, the endoscope was                            passed under direct vision. Throughout the  procedure, the patient's blood pressure, pulse, and                            oxygen saturations were monitored continuously. The                            [id number]-H190 ([id number]) scope was introduced through the                            mouth, and advanced to the second part of duodenum.                            The upper GI endoscopy was accomplished without                             difficulty. The patient tolerated the procedure                            well. Scope In: 1:31:42 PM Scope Out: 1:35:54 PM Total Procedure Duration: 0 hours 4 minutes 12 seconds  Findings:      A 1 cm sliding hiatal hernia was found.      The gastroesophageal flap valve was visualized endoscopically and       classified as Hill Grade II (fold present, opens with respiration).      The entire examined stomach was normal. Biopsies were taken with a cold       forceps for Helicobacter pylori testing.      The examined duodenum was normal. Biopsies were taken with a cold       forceps for histology. Impression:               - 1 cm sliding hiatal hernia.                           - Gastroesophageal flap valve classified as Hill                            Grade II (fold present, opens with respiration).                           - Normal stomach. Biopsied.                           - Normal examined duodenum. Biopsied. Moderate Sedation:      Per Anesthesia Care Recommendation:           - Discharge patient to home (ambulatory).                           - Resume previous diet.                           - Await pathology results.                           - Continue present medications.                           -  If normal biopsies, consider H. pylori breath                            test after 2 weeks being off PPI. Procedure Code(s):        --- Professional ---                           [phone], Esophagogastroduodenoscopy, flexible,                            transoral; with biopsy, single or multiple Diagnosis Code(s):        --- Professional ---                           K44.9, Diaphragmatic hernia without obstruction or                            gangrene                           R10.13, Epigastric pain CPT copyright 2022 American Medical Association. All rights reserved. The codes documented in this report are preliminary and upon coder review may  be  revised to meet current compliance requirements. Maylon Peppers, MD Maylon Peppers,  05/24/2022 1:39:22 PM This report has been signed electronically. Number of Addenda: 0

## 2022-05-24 NOTE — Discharge Instructions (Addendum)
You are being discharged to home.  Resume your previous diet.  We are waiting for your pathology results.  Continue your present medications. If normal biopsies, consider H. pylori breath test after 2 weeks being off PPI.  Your physician has recommended a repeat colonoscopy in three years for surveillance.  Continue Humira every 2 weeks.

## 2022-05-24 NOTE — Anesthesia Preprocedure Evaluation (Signed)
Anesthesia Evaluation  Patient identified by MRN, date of birth, ID band Patient awake    Reviewed: Allergy & Precautions, H&P , NPO status , Patient's Chart, lab work & pertinent test results, reviewed documented beta blocker date and time   Airway Mallampati: II  TM Distance: >3 FB Neck ROM: full    Dental no notable dental hx.    Pulmonary neg pulmonary ROS   Pulmonary exam normal breath sounds clear to auscultation       Cardiovascular Exercise Tolerance: Good negative cardio ROS  Rhythm:regular Rate:Normal     Neuro/Psych  Headaches PSYCHIATRIC DISORDERS Anxiety        GI/Hepatic negative GI ROS, Neg liver ROS,,,  Endo/Other  negative endocrine ROS    Renal/GU negative Renal ROS  negative genitourinary   Musculoskeletal   Abdominal   Peds  Hematology negative hematology ROS (+)   Anesthesia Other Findings   Reproductive/Obstetrics negative OB ROS                             Anesthesia Physical Anesthesia Plan  ASA: 2  Anesthesia Plan: General   Post-op Pain Management:    Induction:   PONV Risk Score and Plan: Propofol infusion  Airway Management Planned:   Additional Equipment:   Intra-op Plan:   Post-operative Plan:   Informed Consent: I have reviewed the patients History and Physical, chart, labs and discussed the procedure including the risks, benefits and alternatives for the proposed anesthesia with the patient or authorized representative who has indicated his/her understanding and acceptance.     Dental Advisory Given  Plan Discussed with: CRNA  Anesthesia Plan Comments:        Anesthesia Quick Evaluation

## 2022-05-24 NOTE — Op Note (Signed)
[facility] Patient Name: Tamara Martin Procedure Date: 05/24/2022 1:18 PM MRN: [id number] Date of Birth: [birth year] Attending MD: Maylon Peppers , , [id number] CSN: [id number] Age: 46 Admit Type: Outpatient Procedure:                Colonoscopy Indications:              Follow-up of longstanding Crohn's disease of the                            small bowel and colon Providers:                Maylon Peppers, Lurline Del, RN, Ladoris Gene                            Technician, Technician Referring MD:              Medicines:                Monitored Anesthesia Care Complications:            No immediate complications. Estimated Blood Loss:     Estimated blood loss: none. Procedure:                Pre-Anesthesia Assessment:                           - Prior to the procedure, a History and Physical                            was performed, and patient medications, allergies                            and sensitivities were reviewed. The patient's                            tolerance of previous anesthesia was reviewed.                           - The risks and benefits of the procedure and the                            sedation options and risks were discussed with the                            patient. All questions were answered and informed                            consent was obtained.                           - ASA Grade Assessment: II - A patient with mild                            systemic disease.                           After obtaining informed consent, the colonoscope  was passed under direct vision. Throughout the                            procedure, the patient's blood pressure, pulse, and                            oxygen saturations were monitored continuously. The                            [id number]-[id number] ([id number]) scope was introduced through                            the anus and advanced to the the terminal ileum.                             The colonoscopy was performed without difficulty.                            The patient tolerated the procedure well. The                            quality of the bowel preparation was excellent. Scope In: 1:41:18 PM Scope Out: 2:02:10 PM Scope Withdrawal Time: 0 hours 17 minutes 57 seconds  Total Procedure Duration: 0 hours 20 minutes 52 seconds  Findings:      The perianal and digital rectal examinations were normal.      The terminal ileum appeared normal. Biopsies were taken with a cold       forceps for histology.      The colon (entire examined portion) appeared normal. Imaging was       performed using white light and narrow band imaging to visualize the       mucosa. Biopsies from the cecum/AC, transverse, DC/sigmoid and rectum       were taken with a cold forceps for histology.      Non-bleeding internal hemorrhoids were found during retroflexion. The       hemorrhoids were small.      Crohn's disease is in endoscopic remission. Impression:               - The examined portion of the ileum was normal.                            Biopsied.                           - The entire examined colon is normal. Biopsied.                           - Non-bleeding internal hemorrhoids. Moderate Sedation:      Per Anesthesia Care Recommendation:           - Discharge patient to home (ambulatory).                           - Resume previous diet.                           -  Await pathology results.                           - Repeat colonoscopy in 3 years for surveillance.                           - Continue Humira every 2 weeks. Procedure Code(s):        --- Professional ---                           [phone], Colonoscopy, flexible; with biopsy, single                            or multiple Diagnosis Code(s):        --- Professional ---                           K64.8, Other hemorrhoids                           K50.80, Crohn's disease of both small and large                             intestine without complications CPT copyright 2022 American Medical Association. All rights reserved. The codes documented in this report are preliminary and upon coder review may  be revised to meet current compliance requirements. Maylon Peppers, MD Maylon Peppers,  05/24/2022 2:09:03 PM This report has been signed electronically. Number of Addenda: 0

## 2022-05-25 NOTE — Anesthesia Postprocedure Evaluation (Signed)
Anesthesia Post Note  Patient: Tamara Martin  Procedure(s) Performed: COLONOSCOPY WITH PROPOFOL ESOPHAGOGASTRODUODENOSCOPY (EGD) WITH PROPOFOL BIOPSY  Patient location during evaluation: Phase II Anesthesia Type: General Level of consciousness: awake Pain management: pain level controlled Vital Signs Assessment: post-procedure vital signs reviewed and stable Respiratory status: spontaneous breathing and respiratory function stable Cardiovascular status: blood pressure returned to baseline and stable Postop Assessment: no headache and no apparent nausea or vomiting Anesthetic complications: no Comments: Late entry   No notable events documented.   Last Vitals:  Vitals:   05/24/22 1216 05/24/22 1406  BP: 136/78 (!) 105/54  Pulse:  84  Resp: 17 18  Temp: (!) 25.6 C 36.8 C  SpO2: 99% 100%    Last Pain:  Vitals:   05/24/22 1406  TempSrc: Oral  PainSc: 0-No pain                 Louann Sjogren

## 2022-05-27 ENCOUNTER — Encounter (INDEPENDENT_AMBULATORY_CARE_PROVIDER_SITE_OTHER): Payer: Self-pay | Admitting: *Deleted

## 2022-05-28 ENCOUNTER — Telehealth: Payer: Self-pay | Admitting: Cardiology

## 2022-05-28 ENCOUNTER — Other Ambulatory Visit (INDEPENDENT_AMBULATORY_CARE_PROVIDER_SITE_OTHER): Payer: Self-pay

## 2022-05-28 ENCOUNTER — Telehealth: Payer: Self-pay

## 2022-05-28 ENCOUNTER — Encounter (INDEPENDENT_AMBULATORY_CARE_PROVIDER_SITE_OTHER): Payer: Self-pay

## 2022-05-28 DIAGNOSIS — R6881 Early satiety: Secondary | ICD-10-CM

## 2022-05-28 DIAGNOSIS — Z8619 Personal history of other infectious and parasitic diseases: Secondary | ICD-10-CM

## 2022-05-28 DIAGNOSIS — K5080 Crohn's disease of both small and large intestine without complications: Secondary | ICD-10-CM

## 2022-05-28 DIAGNOSIS — R1010 Upper abdominal pain, unspecified: Secondary | ICD-10-CM

## 2022-05-28 DIAGNOSIS — R1013 Epigastric pain: Secondary | ICD-10-CM

## 2022-05-28 DIAGNOSIS — A048 Other specified bacterial intestinal infections: Secondary | ICD-10-CM

## 2022-05-28 LAB — SURGICAL PATHOLOGY

## 2022-05-28 NOTE — Telephone Encounter (Signed)
-----   Message from Arnoldo Lenis, MD sent at 05/28/2022  3:22 PM EST ----- Heart monitor shows just some rare extra heart beats which are common and not worrisome but can cause feeling of heart skipping at times. If symptoms not that bothersome could just monitor at this time, if remain bothersome could try lopressor 12.44m bid. Can f/u 6 months  JZandra AbtsMD

## 2022-05-28 NOTE — Telephone Encounter (Signed)
Will route to provider for results.

## 2022-05-28 NOTE — Telephone Encounter (Signed)
Spoke to pt who verbalized understanding. Patient stated that palpitations were not bothersome at this time and would rather not start medication. PCP copied.

## 2022-05-28 NOTE — Telephone Encounter (Signed)
Patient is calling requesting her heart monitor results. Please advise.

## 2022-06-04 ENCOUNTER — Encounter (HOSPITAL_COMMUNITY): Payer: Self-pay | Admitting: Gastroenterology

## 2022-06-13 ENCOUNTER — Other Ambulatory Visit (INDEPENDENT_AMBULATORY_CARE_PROVIDER_SITE_OTHER): Payer: Self-pay | Admitting: Gastroenterology

## 2022-06-22 LAB — H. PYLORI BREATH TEST: H pylori Breath Test: NEGATIVE

## 2022-08-26 ENCOUNTER — Other Ambulatory Visit (HOSPITAL_COMMUNITY): Payer: Self-pay | Admitting: Adult Health

## 2022-08-26 DIAGNOSIS — Z1231 Encounter for screening mammogram for malignant neoplasm of breast: Secondary | ICD-10-CM

## 2022-09-07 ENCOUNTER — Telehealth: Payer: BC Managed Care – PPO | Admitting: Family Medicine

## 2022-09-07 DIAGNOSIS — N3000 Acute cystitis without hematuria: Secondary | ICD-10-CM | POA: Diagnosis not present

## 2022-09-07 MED ORDER — SULFAMETHOXAZOLE-TRIMETHOPRIM 800-160 MG PO TABS: 1.0000 | ORAL_TABLET | Freq: Two times a day (BID) | ORAL | 0 refills | Status: AC

## 2022-09-07 NOTE — Progress Notes (Signed)
E-Visit for Urinary Problems  We are sorry that you are not feeling well.  Here is how we plan to help!  Based on what you shared with me it looks like you most likely have a simple urinary tract infection.  A UTI (Urinary Tract Infection) is a bacterial infection of the bladder.  Most cases of urinary tract infections are simple to treat but a key part of your care is to encourage you to drink plenty of fluids and watch your symptoms carefully.  I have prescribed Bactrim DS One tablet twice a day for 5 days.  Your symptoms should gradually improve. Call us if the burning in your urine worsens, you develop worsening fever, back pain or pelvic pain or if your symptoms do not resolve after completing the antibiotic.  Urinary tract infections can be prevented by drinking plenty of water to keep your body hydrated.  Also be sure when you wipe, wipe from front to back and don't hold it in!  If possible, empty your bladder every 4 hours.  HOME CARE Drink plenty of fluids Compete the full course of the antibiotics even if the symptoms resolve Remember, when you need to go.go. Holding in your urine can increase the likelihood of getting a UTI! GET HELP RIGHT AWAY IF: You cannot urinate You get a high fever Worsening back pain occurs You see blood in your urine You feel sick to your stomach or throw up You feel like you are going to pass out  MAKE SURE YOU  Understand these instructions. Will watch your condition. Will get help right away if you are not doing well or get worse.   Thank you for choosing an e-visit.  Your e-visit answers were reviewed by a board certified advanced clinical practitioner to complete your personal care plan. Depending upon the condition, your plan could have included both over the counter or prescription medications.  Please review your pharmacy choice. Make sure the pharmacy is open so you can pick up prescription now. If there is a problem, you may contact  your provider through [employer] and have the prescription routed to another pharmacy.  Your safety is important to [place]. If you have drug allergies check your prescription carefully.   For the next 24 hours you can use MyChart to ask questions about today's visit, request a non-urgent call back, or ask for a work or school excuse. You will get an email in the next two days asking about your experience. I hope that your e-visit has been valuable and will speed your recovery.    have provided 5 minutes of non face to face time during this encounter for chart review and documentation.

## 2022-09-11 ENCOUNTER — Ambulatory Visit (HOSPITAL_COMMUNITY)
Admission: RE | Admit: 2022-09-11 | Discharge: 2022-09-11 | Disposition: A | Discharge status: Home / Self Care | Payer: BC Managed Care – PPO | Source: Ambulatory Visit | Attending: Adult Health | Admitting: Adult Health

## 2022-09-11 DIAGNOSIS — Z1231 Encounter for screening mammogram for malignant neoplasm of breast: Secondary | ICD-10-CM | POA: Diagnosis present

## 2022-09-17 ENCOUNTER — Ambulatory Visit (INDEPENDENT_AMBULATORY_CARE_PROVIDER_SITE_OTHER): Payer: BC Managed Care – PPO | Admitting: Adult Health

## 2022-09-17 ENCOUNTER — Encounter: Payer: Self-pay | Admitting: Adult Health

## 2022-09-17 VITALS — BP 121/78 | HR 71 | Ht 65.0 in | Wt 151.5 lb

## 2022-09-17 DIAGNOSIS — R5383 Other fatigue: Secondary | ICD-10-CM | POA: Insufficient documentation

## 2022-09-17 DIAGNOSIS — Z8669 Personal history of other diseases of the nervous system and sense organs: Secondary | ICD-10-CM | POA: Diagnosis not present

## 2022-09-17 DIAGNOSIS — R519 Headache, unspecified: Secondary | ICD-10-CM

## 2022-09-17 DIAGNOSIS — H539 Unspecified visual disturbance: Secondary | ICD-10-CM

## 2022-09-17 DIAGNOSIS — R7309 Other abnormal glucose: Secondary | ICD-10-CM | POA: Diagnosis not present

## 2022-09-17 NOTE — Progress Notes (Signed)
  Subjective:     Patient ID: Tamara Martin, female   DOB: Aug 27, 1975, 47 y.o.   MRN: [id number]  HPI Shruti is a 47 year old white female, married, [id number], in complaining of more frequent headaches, feeling tired and head feels funny on and off like a pressure on top and she has blurred vision at times. She has history of migraines. She saw eye doctor and got readers.   Last pap was negative HPV, NILM 04/11/20.  Review of Systems +more frequent headaches    +tired +blurred vision at times, no vision loss Head feels funny on and off like pressure on top. No speech changes Reviewed past medical,surgical, social and family history. Reviewed medications and allergies.  Objective:   Physical Exam BP 121/78 (BP Location: Right Arm, Patient Position: Sitting, Cuff Size: Normal)   Pulse 71   Ht 5\' 5"  (1.651 m)   Wt 151 lb 8 oz (68.7 kg)   LMP 09/13/2022   BMI 25.21 kg/m   Skin warm and dry. Lungs: clear to ausculation bilaterally. Cardiovascular: regular rate and rhythm.  CN 2-12 intact. Fall risk is low  Upstream - 09/17/22 1124       Pregnancy Intention Screening   Does the patient want to become pregnant in the next year? No    Does the patient's partner want to become pregnant in the next year? No    Would the patient like to discuss contraceptive options today? No      Contraception Wrap Up   Current Method Vasectomy    End Method Vasectomy    Contraception Counseling Provided No                Assessment:     1. History of migraine headaches Referred to [place]  - Ambulatory referral to Neurology  2. Tired Feels tired all the time  Will check labs  - CBC - Comprehensive metabolic panel - TSH + free T4 Will message when labs back  3. Elevated hemoglobin A1c - Hemoglobin A1c  4. Changes in vision Blurry at times  Has seen Eye doctor and has readers  - Ambulatory referral to Neurology  5. Frequent headaches Will refer to [place] for evaluation  -  Ambulatory referral to Neurology     Plan:     Return 05/05/23 for pap and physical

## 2022-09-18 LAB — HEMOGLOBIN A1C
Est. average glucose Bld gHb Est-mCnc: 126 mg/dL
Hgb A1c MFr Bld: 6 % — ABNORMAL HIGH (ref 4.8–5.6)

## 2022-09-18 LAB — CBC
Hematocrit: 40.3 % (ref 34.0–46.6)
Hemoglobin: 13.2 g/dL (ref 11.1–15.9)
MCH: 28.2 pg (ref 26.6–33.0)
MCHC: 32.8 g/dL (ref 31.5–35.7)
MCV: 86 fL (ref 79–97)
Platelets: 371 10*3/uL (ref 150–450)
RBC: 4.68 x10E6/uL (ref 3.77–5.28)
RDW: 13.2 % (ref 11.7–15.4)
WBC: 7.4 10*3/uL (ref 3.4–10.8)

## 2022-09-18 LAB — COMPREHENSIVE METABOLIC PANEL
ALT: 13 IU/L (ref 0–32)
AST: 15 IU/L (ref 0–40)
Albumin/Globulin Ratio: 1.7 (ref 1.2–2.2)
Albumin: 4.5 g/dL (ref 3.9–4.9)
Alkaline Phosphatase: 88 IU/L (ref 44–121)
BUN/Creatinine Ratio: 18 (ref 9–23)
BUN: 11 mg/dL (ref 6–24)
Bilirubin Total: 0.2 mg/dL (ref 0.0–1.2)
CO2: 24 mmol/L (ref 20–29)
Calcium: 9.6 mg/dL (ref 8.7–10.2)
Chloride: 102 mmol/L (ref 96–106)
Creatinine, Ser: 0.61 mg/dL (ref 0.57–1.00)
Globulin, Total: 2.6 g/dL (ref 1.5–4.5)
Glucose: 95 mg/dL (ref 70–99)
Potassium: 4.5 mmol/L (ref 3.5–5.2)
Sodium: 140 mmol/L (ref 134–144)
Total Protein: 7.1 g/dL (ref 6.0–8.5)
eGFR: 112 mL/min/{1.73_m2} (ref >59)

## 2022-09-18 LAB — TSH+FREE T4
Free T4: 1.29 ng/dL (ref 0.82–1.77)
TSH: 1.53 u[IU]/mL (ref 0.450–4.500)

## 2022-09-20 ENCOUNTER — Encounter: Payer: Self-pay | Admitting: Adult Health

## 2022-09-20 ENCOUNTER — Other Ambulatory Visit: Payer: Self-pay | Admitting: Adult Health

## 2022-09-20 DIAGNOSIS — E782 Mixed hyperlipidemia: Secondary | ICD-10-CM | POA: Insufficient documentation

## 2022-09-20 DIAGNOSIS — R7989 Other specified abnormal findings of blood chemistry: Secondary | ICD-10-CM | POA: Insufficient documentation

## 2022-09-20 LAB — SPECIMEN STATUS REPORT

## 2022-09-20 MED ORDER — ROSUVASTATIN CALCIUM 10 MG PO TABS: 10.0000 mg | ORAL_TABLET | Freq: Every day | ORAL | 4 refills | Status: DC

## 2022-09-20 MED ORDER — CHOLECALCIFEROL 125 MCG (5000 UT) PO CAPS: 5000.0000 [IU] | ORAL_CAPSULE | Freq: Every day | ORAL | Status: AC

## 2022-09-24 ENCOUNTER — Encounter: Payer: Self-pay | Admitting: Adult Health

## 2022-09-24 LAB — LIPID PANEL W/O CHOL/HDL RATIO
Cholesterol, Total: 202 mg/dL — ABNORMAL HIGH (ref 100–199)
HDL: 42 mg/dL (ref >39)
LDL Chol Calc (NIH): 108 mg/dL — ABNORMAL HIGH (ref 0–99)
Triglycerides: 301 mg/dL — ABNORMAL HIGH (ref 0–149)
VLDL Cholesterol Cal: 52 mg/dL — ABNORMAL HIGH (ref 5–40)

## 2022-09-24 LAB — IRON: Iron: 66 ug/dL (ref 27–159)

## 2022-09-24 LAB — SPECIMEN STATUS REPORT

## 2022-09-24 LAB — VITAMIN D 25 HYDROXY (VIT D DEFICIENCY, FRACTURES): Vit D, 25-Hydroxy: 25.3 ng/mL — ABNORMAL LOW (ref 30.0–100.0)

## 2022-09-26 ENCOUNTER — Encounter: Payer: Self-pay | Admitting: Psychiatry

## 2022-09-26 ENCOUNTER — Ambulatory Visit: Payer: BC Managed Care – PPO | Admitting: Psychiatry

## 2022-09-26 VITALS — BP 121/86 | HR 77 | Ht 65.0 in | Wt 149.5 lb

## 2022-09-26 DIAGNOSIS — G43009 Migraine without aura, not intractable, without status migrainosus: Secondary | ICD-10-CM

## 2022-09-26 DIAGNOSIS — G44229 Chronic tension-type headache, not intractable: Secondary | ICD-10-CM

## 2022-09-26 DIAGNOSIS — G4452 New daily persistent headache (NDPH): Secondary | ICD-10-CM | POA: Diagnosis not present

## 2022-09-26 MED ORDER — BACLOFEN 10 MG PO TABS: ORAL_TABLET | ORAL | 6 refills | Status: DC

## 2022-09-26 MED ORDER — TOPIRAMATE 25 MG PO TABS: ORAL_TABLET | ORAL | 6 refills | Status: DC

## 2022-09-26 MED ORDER — LORAZEPAM 0.5 MG PO TABS: ORAL_TABLET | ORAL | 0 refills | Status: DC

## 2022-09-26 MED ORDER — SUMATRIPTAN SUCCINATE 50 MG PO TABS: 50.0000 mg | ORAL_TABLET | ORAL | 6 refills | Status: DC | PRN

## 2022-09-26 NOTE — Progress Notes (Signed)
Referring:  Estill Dooms, NP [place],  [place] [id number]  PCP: Estill Dooms, NP  Neurology was asked to evaluate Tamara Martin, a 47 year old female for a chief complaint of headaches.  Our recommendations of care will be communicated by shared medical record.    CC:  headaches  History provided from self  HPI:  Medical co-morbidities: Crohn's disease, rheumatoid arthritis, anxiety  The patient presents for evaluation of headaches which began in August last year. She woke up at that time and felt dizzy and spacey. Described dizziness as a sensation of room spinning. She was told she had fluid in her ears and was given antibiotics for this. Vertigo resolved but the spacey feeling never improved. Then she started to develop headaches described as pressure on her vertex. She currently has headaches nearly every day. They are becoming more severe, and she has had to start sleeping them off. She denies photophobia, phonophobia, or nausea. Takes Flexeril as needed, but even a 1/2 tablet makes her too drowsy to function. Tylenol is only somewhat effective, and she cannot take NSAIDs due to Crohn's disease.  Has a history of migraine with visual and sensory aura. These were associated with her birth control and resolved once she was able to stop OCPs. They were also associated with photophobia, phonophobia, and nausea. So far she has not had any recurrence of auras. She previously took Topamax and Imitrex for migraines and believes they were effective.  Headache History: Onset: August Triggers: stress, working on a computer Aura: no Location: vertex Associated Symptoms:  Photophobia: no  Phonophobia: no  Nausea: no Worse with activity?: yes Duration of headaches: hours  Headache days per month: 30 Headache free days per month: 0  Current Treatment: Abortive Tylenol  Preventative none  Prior Therapies                                  Tylenol Flexeril 10 mg TID PRN - drowsiness Buspirone Topamax - thinks it was helpful but stopped it because she was trying to get pregnant  LABS: CBC    Component Value Date/Time   WBC 7.4 09/17/2022 1151   WBC 10.6 (H) 04/09/2022 0041   RBC 4.68 09/17/2022 1151   RBC 4.55 04/09/2022 0041   HGB 13.2 09/17/2022 1151   HCT 40.3 09/17/2022 1151   PLT 371 09/17/2022 1151   MCV 86 09/17/2022 1151   MCH 28.2 09/17/2022 1151   MCH 29.0 04/09/2022 0041   MCHC 32.8 09/17/2022 1151   MCHC 33.4 04/09/2022 0041   RDW 13.2 09/17/2022 1151   LYMPHSABS 3.0 10/09/2021 1611   MONOABS 630 03/13/2016 1619   EOSABS 0.2 10/09/2021 1611   BASOSABS 0.1 10/09/2021 1611      Latest Ref Rng & Units 09/17/2022   11:51 AM 04/09/2022   12:41 AM 03/26/2022    8:20 AM  CMP  Glucose 70 - 99 mg/dL 95  177  102   BUN 6 - 24 mg/dL 11  15  10    Creatinine 0.57 - 1.00 mg/dL 0.61  0.81  0.73   Sodium 134 - 144 mmol/L 140  137  139   Potassium 3.5 - 5.2 mmol/L 4.5  4.5  4.3   Chloride 96 - 106 mmol/L 102  106  102   CO2 20 - 29 mmol/L 24  22  24    Calcium 8.7 -  10.2 mg/dL 9.6  9.4  9.6   Total Protein 6.0 - 8.5 g/dL 7.1   6.9   Total Bilirubin 0.0 - 1.2 mg/dL 0.2   0.4   Alkaline Phos 44 - 121 IU/L 88   84   AST 0 - 40 IU/L 15   17   ALT 0 - 32 IU/L 13   18      IMAGING:  [place] 2003 wnl   Current Outpatient Medications on File Prior to Visit  Medication Sig Dispense Refill   Adalimumab (HUMIRA) 40 MG/0.8ML PSKT Inject 40 mg into the skin every 14 (fourteen) days.     B Complex Vitamins (B COMPLEX PO) Place 1 drop under the tongue daily.     busPIRone (BUSPAR) 5 MG tablet Take 1 tablet (5 mg total) by mouth 2 (two) times daily. 60 tablet 12   CALCIUM PO Take 1 tablet by mouth daily.     Cholecalciferol 125 MCG (5000 UT) capsule Take 1 capsule (5,000 Units total) by mouth daily.     hydrOXYzine (ATARAX) 10 MG tablet Take 1 tablet (10 mg total) by mouth 3 (three) times daily as needed. 30 tablet 3    montelukast (SINGULAIR) 10 MG tablet Take 10 mg by mouth at bedtime.     Multiple Vitamins-Minerals (MULTI ADULT GUMMIES PO) Take 1 each by mouth daily.     pantoprazole (PROTONIX) 40 MG tablet TAKE 1 TABLET BY MOUTH EVERY DAY 30 tablet 3   polyethylene glycol (MIRALAX / GLYCOLAX) 17 g packet Take 17 g by mouth daily as needed for moderate constipation.     Probiotic Product (PROBIOTIC PO) Take 1 capsule by mouth daily.     rosuvastatin (CRESTOR) 10 MG tablet Take 1 tablet (10 mg total) by mouth daily. Take in evenings 30 tablet 4   No current facility-administered medications on file prior to visit.     Allergies: Allergies  Allergen Reactions   Penicillins Hives         Family History: Migraine or other headaches in the family:  no Aneurysms in a first degree relative:  no Brain tumors in the family:  no Other neurological illness in the family:   no  Past Medical History: Past Medical History:  Diagnosis Date   Anxiety 03/04/2013   Arthritis    rheumatoid   Crohn's disease of colon ([place])    Dermoid cyst of eyebrow 10/2015   left   Fibroadenoma of right breast 02/27/2017   Headache, menstrual migraine     Past Surgical History Past Surgical History:  Procedure Laterality Date   BIOPSY  05/24/2022   Procedure: BIOPSY;  Surgeon: Harvel Quale, MD;  [facility];  Service: Gastroenterology;;   COLONOSCOPY  06/19/2005   COLONOSCOPY N/A 08/10/2015   Procedure: COLONOSCOPY;  Surgeon: Rogene Houston, MD;  [facility];  Service: Endoscopy;  Laterality: N/A;  1:25   COLONOSCOPY WITH PROPOFOL N/A 05/24/2022   Procedure: COLONOSCOPY WITH PROPOFOL;  Surgeon: Harvel Quale, MD;  [facility];  Service: Gastroenterology;  Laterality: N/A;  130 ASA 2   ESOPHAGOGASTRODUODENOSCOPY (EGD) WITH PROPOFOL N/A 05/24/2022   Procedure: ESOPHAGOGASTRODUODENOSCOPY (EGD) WITH PROPOFOL;  Surgeon: Harvel Quale, MD;  [facility]  [facility];  Service: Gastroenterology;  Laterality: N/A;   EYE SURGERY     benign tumor   MASS EXCISION Left 10/27/2015   Procedure: EXCISION MASS LEFT BROW ( EXCISION OF SUBMUSCULAR MASS LEFT BROW 2.5CM);  Surgeon:  Irene Limbo, MD;  Location: [facility];  Service: Plastics;  Laterality: Left;    Social History: Social History   Tobacco Use   Smoking status: Never    Passive exposure: Never   Smokeless tobacco: Never  Vaping Use   Vaping Use: Never used  Substance Use Topics   Alcohol use: No    Alcohol/week: 0.0 standard drinks of alcohol   Drug use: No     ROS: Negative for fevers, chills. Positive for headaches. All other systems reviewed and negative unless stated otherwise in HPI.   Physical Exam:   Vital Signs: BP 121/86   Pulse 77   Ht 5\' 5"  (1.651 m)   Wt 149 lb 8 oz (67.8 kg)   LMP 09/13/2022   BMI 24.88 kg/m  GENERAL: well appearing,in no acute distress,alert SKIN:  Color, texture, turgor normal. No rashes or lesions HEAD:  Normocephalic/atraumatic. CV:  RRR RESP: Normal respiratory effort MSK: no tenderness to palpation over occiput, neck, or shoulders  NEUROLOGICAL: Mental Status: Alert, oriented to person, place and time,Follows commands Cranial Nerves: PERRL, visual fields intact to confrontation, extraocular movements intact, facial sensation intact, no facial droop or ptosis, hearing grossly intact, no dysarthria Motor: muscle strength 5/5 both upper and lower extremitiese Reflexes: 2+ throughout Sensation: intact to light touch all 4 extremities Coordination: Finger-to- nose-finger intact bilaterally Gait: normal-based   IMPRESSION: 47 year old female with a history of Crohn's disease, rheumatoid arthritis, anxiety who presents for evaluation of worsening daily headaches which began in August 2023. Will order brain MRI to assess for structural causes of new onset daily headaches. Will restart Topamax as she previously  tolerated this well for headache prevention. Her current headaches are most consistent with tension type headaches. She is unable to tolerate Flexeril due to drowsiness. Will switch this to baclofen as this may be less sedating. Imitrex prescribed for rescue for her more severe headaches.  PLAN: -MRI brain -Prevention: Start Topamax 25 mg QHS, uptitrate by 25 mg weekly up to 100 mg QHS -Rescue: Start baclofen 5-10 mg TID PRN for tension headaches/neck pain. Start Imitrex 50 mg PRN for migraine rescue -Next steps: consider neck Pt, amitriptyline   I spent a total of 26 minutes chart reviewing and counseling the patient. Headache education was done. Discussed treatment options including preventive and acute medications. Discussed medication side effects, adverse reactions and drug interactions. Written educational materials and patient instructions outlining all of the above were given.  Follow-up: 6 months   Genia Harold, MD 09/26/2022   8:46 AM

## 2022-10-02 ENCOUNTER — Telehealth: Payer: Self-pay | Admitting: Psychiatry

## 2022-10-02 NOTE — Telephone Encounter (Signed)
Pt scheduled for MR brain w/wo contrast at [facility] for 10/08/22 at 8:30am  [employer] auth# [id number] (10/01/22-10/30/22)

## 2022-10-08 ENCOUNTER — Ambulatory Visit (INDEPENDENT_AMBULATORY_CARE_PROVIDER_SITE_OTHER): Payer: BC Managed Care – PPO

## 2022-10-08 DIAGNOSIS — G4452 New daily persistent headache (NDPH): Secondary | ICD-10-CM | POA: Diagnosis not present

## 2022-10-08 MED ORDER — GADOBENATE DIMEGLUMINE 529 MG/ML IV SOLN
14.0000 mL | Freq: Once | INTRAVENOUS | Status: AC | PRN
  Administered 2022-10-08: 14 mL via INTRAVENOUS

## 2022-10-16 ENCOUNTER — Other Ambulatory Visit (INDEPENDENT_AMBULATORY_CARE_PROVIDER_SITE_OTHER): Payer: Self-pay | Admitting: Gastroenterology

## 2022-10-21 ENCOUNTER — Ambulatory Visit (INDEPENDENT_AMBULATORY_CARE_PROVIDER_SITE_OTHER): Payer: BC Managed Care – PPO | Admitting: Gastroenterology

## 2022-10-21 ENCOUNTER — Encounter (INDEPENDENT_AMBULATORY_CARE_PROVIDER_SITE_OTHER): Payer: Self-pay | Admitting: Gastroenterology

## 2022-10-21 VITALS — BP 123/81 | HR 83 | Temp 98.1°F | Ht 65.0 in | Wt 152.0 lb

## 2022-10-21 DIAGNOSIS — K5080 Crohn's disease of both small and large intestine without complications: Secondary | ICD-10-CM | POA: Diagnosis not present

## 2022-10-21 DIAGNOSIS — K5900 Constipation, unspecified: Secondary | ICD-10-CM | POA: Diagnosis not present

## 2022-10-21 NOTE — Patient Instructions (Signed)
Increase water intake, aim for atleast 64 oz per day Increase fruits, veggies and whole grains, kiwi and prunes are especially good for constipation You can try decreasing your fiber some, if this does not improve constipation, I would increase miralax to 1 capful every 12 hours, you may need to play around with the dosage some to find what works for you best, you can go up to 1 capful or miralax every 8 hours if needed If constipation is not improving with these changes, please let me know Continue protonix 40mg  daily   Follow up 6 months

## 2022-10-21 NOTE — Progress Notes (Unsigned)
Referring Provider: Elfredia Nevins, MD Primary Care Physician:  Adline Potter, NP Primary GI Physician: Levon Hedger   Chief Complaint  Patient presents with   Crohn's Disease    6 month follow up on Crohn's. No symptoms. Was switched to hyrimoz. Has taken one injection since switch from humira.    HPI:   Tamara Martin is a 47 y.o. female with past medical history of Crohn's disease, RA, anxiety   Patient presenting today for follow up of Crohn's disease.  History:  26-year history of ileal and rectal Crohn disease. Colonoscopy in February 2017 revealed normal terminal ileum and colonoscopy except mild distal proctitis and she was treated with Canasa suppositories.  She had been maintained on oral mesalamine which was subsequently discontinued in August 2022 when she was begun on Humira/adalimumab for arthritis and she is being followed in rheumatology clinic in [place].   Last seen October 2023, at that time previously she had pain around her bra line, told she had an ulcer, H pylori testing was positive and she was treated with confirmed eradication. She notes that in July she began having similar pain around her bra line with belching. She denies nausea or vomiting. Feels that appetite has decreased some recently but has been trying to eat better as she notes that her a1c was recently elevated. She states that in the middle of eating she may develop some abdominal pain, and some early satiety. Usually she can stop eating and this will pass, this is occurring 2-3x/week. She states that she did start PPi that was sent in July but did not notice any results. Symptoms feel very similar to previous H pylori infection.   maintained on Humira 40mg  q2 weeks. She is having a BM maybe every other day with some continued constipation. Denies rectal bleeding. Since she changed her diet she has noticed an easier time having a BM. She was previously on fiber gummy but recently switched to  metamucil pill, is taking 2-4 pills per day  Recommended to continue with Humira at current dose, schedule Colonoscopy and EGD, Rx protonix 40mg  daily, avoid nsaids, continue fiber BID, good water intake  EGD and Colon as below, H Pylori breath testing thereafter in December 2023 was negative.  Most recent labs with CBC, CMP, TSH in march 2024 all WNL  Present:  States that she is feeling better since starting protonix. She has not had any abdominal pain since starting protonix. She denies nausea or vomiting.  Her humira was recently switched to Hyrimoz. She has only had one dose of this so far. She has constipation, has to take miralax to have a BM. She is taking 1 capful of miralax though not having a BM everyday. She feels bloated as if she needs to have better BMs. She is also doing metamucil fiber. She is doing atleast 40 oz of water per day. Sometimes stools are harder when she is trying to have a BM. Denies rectal bleeding or melena.   Extraintestinal Manifestations: Skin:  no rashes or lesions  Joints: no recent issues, follows with Rheum for RA Eyes: has eye doctor appt coming up, recently with blurred vision up close    Flu vaccine: unsure of last one Covid vaccines: never  Pap smear: Oct 2023  Last Colonoscopy: 05/2022- The examined portion of the ileum was normal.  Biopsied.                           - The entire examined colon is normal. Biopsied.                           - Non-bleeding internal hemorrhoids.  Last Endoscopy:05/2022- 1 cm sliding hiatal hernia.                           - Gastroesophageal flap valve classified as Hill                            Grade II (fold present, opens with respiration).                           - Normal stomach. Biopsied-mild gastritis                            - Normal examined duodenum. Biopsied-normal   Recommendations:  Repeat colonoscopy in 3 years   Past Medical History:  Diagnosis Date    Anxiety 03/04/2013   Arthritis    rheumatoid   Crohn's disease of colon    Dermoid cyst of eyebrow 10/2015   left   Fibroadenoma of right breast 02/27/2017   Headache, menstrual migraine     Past Surgical History:  Procedure Laterality Date   BIOPSY  05/24/2022   Procedure: BIOPSY;  Surgeon: Dolores Frame, MD;  [facility];  Service: Gastroenterology;;   COLONOSCOPY  06/19/2005   COLONOSCOPY N/A 08/10/2015   Procedure: COLONOSCOPY;  Surgeon: Malissa Hippo, MD;  [facility];  Service: Endoscopy;  Laterality: N/A;  1:25   COLONOSCOPY WITH PROPOFOL N/A 05/24/2022   Procedure: COLONOSCOPY WITH PROPOFOL;  Surgeon: Dolores Frame, MD;  [facility];  Service: Gastroenterology;  Laterality: N/A;  130 ASA 2   ESOPHAGOGASTRODUODENOSCOPY (EGD) WITH PROPOFOL N/A 05/24/2022   Procedure: ESOPHAGOGASTRODUODENOSCOPY (EGD) WITH PROPOFOL;  Surgeon: Dolores Frame, MD;  [facility];  Service: Gastroenterology;  Laterality: N/A;   EYE SURGERY     benign tumor   MASS EXCISION Left 10/27/2015   Procedure: EXCISION MASS LEFT BROW ( EXCISION OF SUBMUSCULAR MASS LEFT BROW 2.5CM);  Surgeon: Glenna Fellows, MD;  Location: [facility];  Service: Plastics;  Laterality: Left;    Current Outpatient Medications  Medication Sig Dispense Refill   B Complex Vitamins (B COMPLEX PO) Place 1 drop under the tongue daily.     baclofen (LIORESAL) 10 MG tablet Take 1/2-1 tab up to 3 times a day as needed for headaches and neck pain 60 each 6   busPIRone (BUSPAR) 5 MG tablet Take 1 tablet (5 mg total) by mouth 2 (two) times daily. 60 tablet 12   CALCIUM PO Take 1 tablet by mouth daily.     Cholecalciferol 125 MCG (5000 UT) capsule Take 1 capsule (5,000 Units total) by mouth daily.     hydrOXYzine (ATARAX) 10 MG tablet Take 1 tablet (10 mg total) by mouth 3 (three) times daily as needed. 30 tablet 3   HYRIMOZ 40 MG/0.4ML SOAJ  Inject into the skin. 40mg  every 14 days     montelukast (SINGULAIR) 10 MG tablet Take 10 mg  by mouth at bedtime.     Multiple Vitamins-Minerals (MULTI ADULT GUMMIES PO) Take 1 each by mouth daily.     pantoprazole (PROTONIX) 40 MG tablet TAKE 1 TABLET BY MOUTH EVERY DAY 90 tablet 00   polyethylene glycol (MIRALAX / GLYCOLAX) 17 g packet Take 17 g by mouth daily as needed for moderate constipation.     Probiotic Product (PROBIOTIC PO) Take 1 capsule by mouth daily.     SUMAtriptan (IMITREX) 50 MG tablet Take 1 tablet (50 mg total) by mouth every 2 (two) hours as needed for migraine. May repeat in 2 hours if headache persists or recurs. 10 tablet 6   topiramate (TOPAMAX) 25 MG tablet Take 25 mg (1 pill) at bedtime for one week, then increase to 50 mg (2 pills) at bedtime for one week, then take 75 mg (3 pills) at bedtime for one week, then take 100 mg (4 pills) at bedtime and continue on this dose 120 tablet 6   rosuvastatin (CRESTOR) 10 MG tablet Take 1 tablet (10 mg total) by mouth daily. Take in evenings (Patient not taking: Reported on 10/21/2022) 30 tablet 4   No current facility-administered medications for this visit.    Allergies as of 10/21/2022 - Review Complete 10/21/2022  Allergen Reaction Noted   Penicillins Hives 05/03/2011    Family History  Problem Relation Age of Onset   Diabetes Maternal Grandmother    Hypertension Maternal Grandmother    Kidney failure Maternal Grandmother    Diabetes Maternal Grandfather    Cirrhosis Father    Cancer Father        stomach and bone   Atrial fibrillation Mother    Cancer Maternal Aunt        lung   Cancer Maternal Uncle        bones    Social History   Socioeconomic History   Marital status: Married    Spouse name: Not on file   Number of children: Not on file   Years of education: Not on file   Highest education level: Not on file  Occupational History   Not on file  Tobacco Use   Smoking status: Never    Passive  exposure: Never   Smokeless tobacco: Never  Vaping Use   Vaping Use: Never used  Substance and Sexual Activity   Alcohol use: No    Alcohol/week: 0.0 standard drinks of alcohol   Drug use: No   Sexual activity: Yes    Birth control/protection: Other-see comments    Comment: vasectomy  Other Topics Concern   Not on file  Social History Narrative   Left Handed   No Caffeine    Social Determinants of Health   Financial Resource Strain: Low Risk  (05/01/2022)   Overall Financial Resource Strain (CARDIA)    Difficulty of Paying Living Expenses: Not very hard  Food Insecurity: No Food Insecurity (05/01/2022)   Hunger Vital Sign    Worried About Running Out of Food in the Last Year: Never true    Ran Out of Food in the Last Year: Never true  Transportation Needs: No Transportation Needs (05/01/2022)   PRAPARE - Administrator, Civil Service (Medical): No    Lack of Transportation (Non-Medical): No  Physical Activity: Inactive (05/01/2022)   Exercise Vital Sign    Days of Exercise per Week: 0 days    Minutes of Exercise per Session: 0 min  Stress: No Stress Concern Present (05/01/2022)   [employer]  of Occupational Health - Occupational Stress Questionnaire    Feeling of Stress : Only a little  Social Connections: Moderately Integrated (05/01/2022)   Social Connection and Isolation Panel [NHANES]    Frequency of Communication with Friends and Family: Three times a week    Frequency of Social Gatherings with Friends and Family: Once a week    Attends Religious Services: More than 4 times per year    Active Member of [employer] or Organizations: No    Attends Engineer, structural: Never    Marital Status: Married    Review of systems General: negative for malaise, night sweats, fever, chills, weight los Neck: Negative for lumps, goiter, pain and significant neck swelling Resp: Negative for cough, wheezing, dyspnea at rest CV: Negative for chest pain, leg  swelling, palpitations, orthopnea GI: denies melena, hematochezia, nausea, vomiting, diarrhea, constipation, dysphagia, odyonophagia, early satiety or unintentional weight loss.  MSK: Negative for joint pain or swelling, back pain, and muscle pain. Derm: Negative for itching or rash Psych: Denies depression, anxiety, memory loss, confusion. No homicidal or suicidal ideation.  Heme: Negative for prolonged bleeding, bruising easily, and swollen nodes. Endocrine: Negative for cold or heat intolerance, polyuria, polydipsia and goiter. Neuro: negative for tremor, gait imbalance, syncope and seizures. The remainder of the review of systems is noncontributory.  Physical Exam: BP 123/81 (BP Location: Left Arm, Patient Position: Sitting, Cuff Size: Normal)   Pulse 83   Temp 98.1 F (36.7 C) (Oral)   Ht  (1.651 m)   Wt 152 lb (68.9 kg)   BMI 25.29 kg/m  General:   Alert and oriented. No distress noted. Pleasant and cooperative.  Head:  Normocephalic and atraumatic. Eyes:  Conjuctiva clear without scleral icterus. Mouth:  Oral mucosa pink and moist. Good dentition. No lesions. Heart: Normal rate and rhythm, s1 and s2 heart sounds present.  Lungs: Clear lung sounds in all lobes. Respirations equal and unlabored. Abdomen:  +BS, soft, non-tender and non-distended. No rebound or guarding. No HSM or masses noted. Derm: No palmar erythema or jaundice Msk:  Symmetrical without gross deformities. Normal posture. Extremities:  Without edema. Neurologic:  Alert and  oriented x4 Psych:  Alert and cooperative. Normal mood and affect.  Invalid input(s): "6 MONTHS"   ASSESSMENT: Tamara Martin is a 47 y.o. female presenting today    PLAN:  Continue with Humira at current dose 2. Decrease fiber, if constipation not improved can increase miralax to BID  3. Continue protonix  daily   All questions were answered, patient verbalized understanding and is in agreement with plan as outlined  above.   Follow Up: 6 months   Deborah Dondero L. Jeanmarie Hubert, MSN, APRN, AGNP-C Adult-Gerontology Nurse Practitioner [facility]

## 2022-10-22 DIAGNOSIS — K5900 Constipation, unspecified: Secondary | ICD-10-CM | POA: Insufficient documentation

## 2022-10-28 ENCOUNTER — Other Ambulatory Visit (INDEPENDENT_AMBULATORY_CARE_PROVIDER_SITE_OTHER): Payer: Self-pay | Admitting: *Deleted

## 2022-10-28 ENCOUNTER — Telehealth (INDEPENDENT_AMBULATORY_CARE_PROVIDER_SITE_OTHER): Payer: Self-pay | Admitting: *Deleted

## 2022-10-28 DIAGNOSIS — Z79899 Other long term (current) drug therapy: Secondary | ICD-10-CM

## 2022-10-28 DIAGNOSIS — K5080 Crohn's disease of both small and large intestine without complications: Secondary | ICD-10-CM

## 2022-10-28 NOTE — Telephone Encounter (Signed)
Tamara Frame, MD  Raquel James, NP Cc: Metro Kung, LPN Will need to update Quantiferon and hepatitis B surface Ag. Serology.  Toniann Fail please send her those orders and let her know she needs to do the blood testing. Thanks       Lab order put in and patient was called and notified.

## 2022-10-29 ENCOUNTER — Ambulatory Visit: Payer: BC Managed Care – PPO | Admitting: Cardiology

## 2022-11-07 LAB — HEPATITIS B SURFACE ANTIGEN: Hepatitis B Surface Ag: NEGATIVE

## 2022-11-07 LAB — QUANTIFERON-TB GOLD PLUS
QuantiFERON Mitogen Value: >10 IU/mL
QuantiFERON Nil Value: 0.01 IU/mL
QuantiFERON TB1 Ag Value: 0.01 IU/mL
QuantiFERON TB2 Ag Value: 0.01 IU/mL
QuantiFERON-TB Gold Plus: NEGATIVE

## 2023-01-08 ENCOUNTER — Encounter: Payer: Self-pay | Admitting: Cardiology

## 2023-01-10 ENCOUNTER — Other Ambulatory Visit: Payer: Self-pay | Admitting: Family Medicine

## 2023-01-10 NOTE — Telephone Encounter (Signed)
Unable to refill per protocol, Rx expired. Provider not at this practice.  Requested Prescriptions  Pending Prescriptions Disp Refills   fluticasone (FLONASE) 50 MCG/ACT nasal spray [Pharmacy Med Name: FLUTICASONE PROP 50 MCG SPRAY] 16 mL 2    Sig: PLACE 1 SPRAY INTO BOTH NOSTRILS 2 (TWO) TIMES DAILY     There is no refill protocol information for this order

## 2023-01-16 ENCOUNTER — Other Ambulatory Visit (INDEPENDENT_AMBULATORY_CARE_PROVIDER_SITE_OTHER): Payer: Self-pay | Admitting: *Deleted

## 2023-01-17 ENCOUNTER — Other Ambulatory Visit (INDEPENDENT_AMBULATORY_CARE_PROVIDER_SITE_OTHER): Payer: Self-pay | Admitting: Gastroenterology

## 2023-01-17 ENCOUNTER — Ambulatory Visit (INDEPENDENT_AMBULATORY_CARE_PROVIDER_SITE_OTHER): Payer: BC Managed Care – PPO | Admitting: Gastroenterology

## 2023-01-17 ENCOUNTER — Ambulatory Visit: Payer: BC Managed Care – PPO | Admitting: Adult Health

## 2023-01-17 ENCOUNTER — Encounter (INDEPENDENT_AMBULATORY_CARE_PROVIDER_SITE_OTHER): Payer: Self-pay | Admitting: Gastroenterology

## 2023-01-17 VITALS — BP 133/86 | HR 86 | Temp 98.4°F | Ht 65.0 in | Wt 147.0 lb

## 2023-01-17 DIAGNOSIS — Z0000 Encounter for general adult medical examination without abnormal findings: Secondary | ICD-10-CM

## 2023-01-17 DIAGNOSIS — K5904 Chronic idiopathic constipation: Secondary | ICD-10-CM | POA: Diagnosis not present

## 2023-01-17 DIAGNOSIS — R10823 Right lower quadrant rebound abdominal tenderness: Secondary | ICD-10-CM

## 2023-01-17 DIAGNOSIS — K5080 Crohn's disease of both small and large intestine without complications: Secondary | ICD-10-CM

## 2023-01-17 MED ORDER — VITAMIN D (ERGOCALCIFEROL) 1.25 MG (50000 UNIT) PO CAPS: 50000.0000 [IU] | ORAL_CAPSULE | ORAL | 0 refills | Status: AC

## 2023-01-17 MED ORDER — POLYETHYLENE GLYCOL 3350 17 G PO PACK
17.0000 g | PACK | Freq: Two times a day (BID) | ORAL | 2 refills | Status: AC
Start: 2023-01-17 — End: 2023-04-17

## 2023-01-17 MED ORDER — ENEMA 7-19 GM/118ML RE ENEM
1.0000 | ENEMA | Freq: Two times a day (BID) | RECTAL | 0 refills | Status: AC
Start: 2023-01-17 — End: 2023-01-20

## 2023-01-17 MED ORDER — GOLYTELY 236 G PO SOLR
4000.0000 mL | Freq: Once | ORAL | 0 refills | Status: AC
Start: 2023-01-17 — End: 2023-01-17

## 2023-01-17 NOTE — Patient Instructions (Signed)
It was very nice to meet you today, as dicussed with will plan for the following : 1) Obtain blood work and stool studies 2) Bowel purge today with Golytely 4 liters and enemas 3) Miralax twice daily 4) Colonoscopy to be scheduled

## 2023-01-17 NOTE — Progress Notes (Signed)
[name] , M.D. Gastroenterology & Hepatology [facility]enter[facility] [address] [place], [place] [address] Primary Care Physician: Adline Potter, NP [address] [place] [address]  Chief Complaint:  Worsening constipation , rectal bulge/protrusion  History of Present Illness:  Tamara Martin is a 47 y.o. female with history of ileocolonic Crohn's disease who presents for evaluation of worsening constipation and rectal protrusion/bulge .   IBD HX: Date of dx: age 20  Extent of disease on first presentation: Distal proctitis  Previous therapies: Pentasa 4gm, Humira , Canasa  Current therapy: Hyrimoz 40mg  Q2w  Extraintestinal manifestations: Arthritis following with rheum  Co-existing immune conditions: none  7/12 /2024  Patient reports that she suffers from constipation for a while, but recently has been worse.  She had been had a bowel movement in the past 5 days.  Denies any blood in the stool..  Patient reports feeling a bulge in her rectum which she has to manually reduce.  Recently she feels her arthritis has worsened.  Patient denies taking any NSAIDs is compliant with Hyrimoz 40mg  Q2w without any discontinuation.  Patient reports that beginning of this year due to insurance issues Humira was switched to Hyrimoz ( adlimumab biosimilar) .  Last upper endoscopy and colonoscopy November 2023 without any endoscopy disease.  History : Colonoscopy in February 2017 revealed normal terminal ileum and colonoscopy except mild distal proctitis and she was treated with Canasa suppositories.  She was maintained on oral mesalamine which was subsequently discontinued in August 2022 when she was begun on Humira/adalimumab for arthritis and she is being followed in rheumatology clinic in [place]. 07/2022 due to insurance issues Humira was switched to Hyrimoz ( adlimumab biosimilar) . Rheumatology manages her medication therapy as she  also has history of RA.   Last EGD/Colonoscopy :  05/2022  - 1 cm sliding hiatal hernia. 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 - Normal stomach. Biopsied.  - The examined portion of the ileum was normal. Biopsied. - The entire examined colon is normal. Biopsied. - Non- bleeding internal hemorrhoids.   FINAL MICROSCOPIC DIAGNOSIS:   A. SMALL BOWEL, BIOPSY:  Benign duodenal mucosa with no diagnostic abnormality   B. GASTRIC BIOPSY:  Reactive gastropathy and mild chronic gastritis  Negative for H. pylori, intestinal metaplasia, dysplasia and carcinoma   C. TERMINAL, ILEUM, BIOPSY:  Benign ileal mucosa with no diagnostic abnormality   D. CECUM AND ASCENDING COLON, BIOPSY:  Benign colonic mucosa with no diagnostic abnormality   E. TRANSVERSE COLON, BIOPSY:  Benign colonic mucosa with no diagnostic abnormality   F. SIGMOID AND DESCENDING COLON, BIOPSY:  Benign colonic mucosa with no diagnostic abnormality   G. RECTAL BIOPSY:  Benign colonic mucosa with no diagnostic abnormality    FHx: neg for any gastrointestinal/liver disease, no malignancies Social: neg smoking, alcohol or illicit drug use Surgical: no abdominal surgeries  Past Medical History: Past Medical History:  Diagnosis Date   Anxiety 03/04/2013   Arthritis    rheumatoid   Crohn's disease of colon (HCC)    Dermoid cyst of eyebrow 10/2015   left   Fibroadenoma of right breast 02/27/2017   Headache, menstrual migraine     Past Surgical History: Past Surgical History:  Procedure Laterality Date   BIOPSY  05/24/2022   Procedure: BIOPSY;  Surgeon: Dolores Frame, MD;  [facility];  Service: Gastroenterology;;   COLONOSCOPY  06/19/2005   COLONOSCOPY N/A 08/10/2015   Procedure: COLONOSCOPY;  Surgeon: Malissa Hippo, MD;  [facility];  Service: Endoscopy;  Laterality: N/A;  1:25   COLONOSCOPY WITH PROPOFOL N/A  05/24/2022   Procedure: COLONOSCOPY WITH PROPOFOL;  Surgeon: Dolores Frame, MD;  [facility];  Service: Gastroenterology;  Laterality: N/A;  130 ASA 2   ESOPHAGOGASTRODUODENOSCOPY (EGD) WITH PROPOFOL N/A 05/24/2022   Procedure: ESOPHAGOGASTRODUODENOSCOPY (EGD) WITH PROPOFOL;  Surgeon: Dolores Frame, MD;  [facility];  Service: Gastroenterology;  Laterality: N/A;   EYE SURGERY     benign tumor   MASS EXCISION Left 10/27/2015   Procedure: EXCISION MASS LEFT BROW ( EXCISION OF SUBMUSCULAR MASS LEFT BROW 2.5CM);  Surgeon: Glenna Fellows, MD;  Location: [facility];  Service: Plastics;  Laterality: Left;    Family History: Family History  Problem Relation Age of Onset   Diabetes Maternal Grandmother    Hypertension Maternal Grandmother    Kidney failure Maternal Grandmother    Diabetes Maternal Grandfather    Cirrhosis Father    Cancer Father        stomach and bone   Atrial fibrillation Mother    Cancer Maternal Aunt        lung   Cancer Maternal Uncle        bones    Social History: Social History   Tobacco Use  Smoking Status Never   Passive exposure: Never  Smokeless Tobacco Never   Social History   Substance and Sexual Activity  Alcohol Use No   Alcohol/week: 0.0 standard drinks of alcohol   Social History   Substance and Sexual Activity  Drug Use No    Allergies: Allergies  Allergen Reactions   Penicillins Hives         Medications: Current Outpatient Medications  Medication Sig Dispense Refill   B Complex Vitamins (B COMPLEX PO) Place 1 drop under the tongue daily.     baclofen (LIORESAL) 10 MG tablet Take 1/2-1 tab up to 3 times a day as needed for headaches and neck pain 60 each 6   busPIRone (BUSPAR) 5 MG tablet Take 1 tablet (5 mg total) by mouth 2 (two) times daily. 60 tablet 12   CALCIUM PO Take 1 tablet by mouth daily.     Cholecalciferol 125 MCG (5000 UT) capsule Take 1 capsule  (5,000 Units total) by mouth daily.     hydrOXYzine (ATARAX) 10 MG tablet Take 1 tablet (10 mg total) by mouth 3 (three) times daily as needed. 30 tablet 3   HYRIMOZ 40 MG/0.4ML SOAJ Inject into the skin. 40mg  every 14 days     montelukast (SINGULAIR) 10 MG tablet Take 10 mg by mouth at bedtime.     Multiple Vitamins-Minerals (MULTI ADULT GUMMIES PO) Take 1 each by mouth daily.     pantoprazole (PROTONIX) 40 MG tablet TAKE 1 TABLET BY MOUTH EVERY DAY 90 tablet 00   polyethylene glycol (GOLYTELY) 236 g solution Take 4,000 mLs by mouth once for 1 dose. 4000 mL 0   polyethylene glycol (MIRALAX / GLYCOLAX) 17 g packet Take 17 g by mouth daily as needed for moderate constipation.     polyethylene glycol (MIRALAX / GLYCOLAX) 17 g packet Take 17 g by mouth 2 (two) times daily. 60 packet 2   Probiotic Product (PROBIOTIC PO) Take 1 capsule by mouth daily.     Sodium Phosphates (ENEMA) 7-19 GM/118ML ENEM Place 133 mLs (1 enema  total) rectally in the morning and at bedtime for 3 days. 798 mL 0   SUMAtriptan (IMITREX) 50 MG tablet Take 1 tablet (50 mg total) by mouth every 2 (two) hours as needed for migraine. May repeat in 2 hours if headache persists or recurs. 10 tablet 6   topiramate (TOPAMAX) 25 MG tablet Take 25 mg (1 pill) at bedtime for one week, then increase to 50 mg (2 pills) at bedtime for one week, then take 75 mg (3 pills) at bedtime for one week, then take 100 mg (4 pills) at bedtime and continue on this dose 120 tablet 6   No current facility-administered medications for this visit.    Review of Systems: GENERAL: negative for malaise, night sweats HEENT: No changes in hearing or vision, no nose bleeds or other nasal problems. NECK: Negative for lumps, goiter, pain and significant neck swelling RESPIRATORY: Negative for cough, wheezing CARDIOVASCULAR: Negative for chest pain, leg swelling, palpitations, orthopnea GI: SEE HPI MUSCULOSKELETAL: Positive joint pain s SKIN: Negative for  lesions, rash HEMATOLOGY Negative for prolonged bleeding, bruising easily, and swollen nodes. ENDOCRINE: Negative for cold or heat intolerance, polyuria, polydipsia and goiter. NEURO: negative for tremor, gait imbalance, syncope and seizures. The remainder of the review of systems is noncontributory.   Physical Exam: BP 133/86   Pulse 86   Temp 98.4 F (36.9 C) (Oral)   Ht 5\' 5"  (1.651 m)   Wt 147 lb (66.7 kg)   BMI 24.46 kg/m  GENERAL: The patient is AO x3, in no acute distress. HEENT: Head is normocephalic and atraumatic. EOMI are intact. Mouth is well hydrated . One apthous ulcer seen  NECK: Supple. No masses LUNGS: Clear to auscultation. No presence of rhonchi/wheezing/rales. Adequate chest expansion HEART: RRR, normal s1 and s2. ABDOMEN: Soft, RLQ tenderness, no guarding, no peritoneal signs, and nondistended. BS +. No masses.  Chaperone: Alain Honey LPN  EXTREMITIES: Without any cyanosis, clubbing, rash, lesions or edema. NEUROLOGIC: AOx3, no focal motor deficit. SKIN: no jaundice, no rashes   Imaging/Labs: as above  I personally reviewed and interpreted the available labs, imaging and endoscopic files.  Impression and Plan:  Tyarra P Lillo is a 47 y.o. female with history of ileocolonic Crohn's disease , previously on pentasa and Humira , changed to Hyrimoz ( adlimumab biosimilar), RA  who presents for evaluation of worsening constipation and rectal protrusion/bulge .    #Constipation   Patient with [place] stool 1-2 rectal exam with large stool burden, patient have not had a bowel movement for 5 days.  TSH and T4 were normal March 2024  Will give trial of bowel purge with 4 L GoLytely and MiraLAX twice daily thereafter.  Tapwater enema as needed  #Crohns colitis  Patient has history of mild colitis was on Pentasa and Humira patient was changed to generic Hyrimoz given insurance issues.Last upper endoscopy colonoscopy November 2023 with endoscopy and  histological remission.  Although exam today with right lower quadrant abdominal tenderness 1 oral aphthous ulcer, subjective worsening of joint pain, abdominal bloating and altered bowel movement concerns relapse/ change in disease activity .  Will obtain inflammatory markers and CRP fecal calprotectin, adalimumab level antibodies.  Given exam finding of abdominal tenderness and aphthous mouth ulcer with proceed with early colonoscopy with TI intubation as discussed and agreed with patient.  Patient symptoms change after changing to generic adalimumab, which can have different efficacy than brand adalimumab. Hence change in medication for over 6 months reasonable to proceed with endoscopic  evaluation.  If colonoscopy is normal with TI intubation may need to proceed with CT enterography to evaluate small bowel  Supplement vitamin D , levels are low 25.3   IBD HCM:  - HAV Ab: need to check immunity  - HBV: need to check immunity  - HCV Ab: need to check  - Influenza:UTD - PCV13: follow up with PCP  - PPSV23: follow up with PCP - Iron studies: will obtain   - B12 level: need to check  - Total 25-Vit D: 25, low  - Quantiferon / ZOX:[id number] 2024 - DEXA: follow up with PCP  - DERM: Need exam  - OPHTHAL: Need exam  GYN: Need exam  All questions were answered.      [name], MD Gastroenterology and Hepatology [facility]

## 2023-01-20 ENCOUNTER — Telehealth (INDEPENDENT_AMBULATORY_CARE_PROVIDER_SITE_OTHER): Payer: Self-pay | Admitting: Gastroenterology

## 2023-01-20 NOTE — Telephone Encounter (Signed)
Left message for pt to return call to schedule TCS with Dr.Ahmed,ASA 2.

## 2023-01-23 ENCOUNTER — Encounter (INDEPENDENT_AMBULATORY_CARE_PROVIDER_SITE_OTHER): Payer: Self-pay | Admitting: Gastroenterology

## 2023-01-23 NOTE — Telephone Encounter (Signed)
Called patient to get more information and she states since she did bowel prep on Friday she is having non stop gurgling sounds and it even wakes her at night. She has had loose stools since doing prep on Friday, sat and Sunday. On Monday started back on miralax bid and stools still loose so she cut down to once day dosing on miralax and today did not take miralax due to loose stools. She is concerned about the gurlging sounds. She has not scheduled her colonoscopy yet due to starting a new job and she wants to call her insurance to see about the cost first.

## 2023-01-24 LAB — COMPREHENSIVE METABOLIC PANEL
ALT: 13 IU/L (ref 0–32)
AST: 18 IU/L (ref 0–40)
Albumin: 4.9 g/dL (ref 3.9–4.9)
Alkaline Phosphatase: 94 IU/L (ref 44–121)
BUN/Creatinine Ratio: 17 (ref 9–23)
BUN: 13 mg/dL (ref 6–24)
Bilirubin Total: 0.5 mg/dL (ref 0.0–1.2)
CO2: 22 mmol/L (ref 20–29)
Calcium: 9.8 mg/dL (ref 8.7–10.2)
Chloride: 100 mmol/L (ref 96–106)
Creatinine, Ser: 0.77 mg/dL (ref 0.57–1.00)
Globulin, Total: 3.1 g/dL (ref 1.5–4.5)
Glucose: 95 mg/dL (ref 70–99)
Potassium: 4.8 mmol/L (ref 3.5–5.2)
Sodium: 139 mmol/L (ref 134–144)
Total Protein: 8 g/dL (ref 6.0–8.5)
eGFR: 96 mL/min/{1.73_m2} (ref >59)

## 2023-01-24 LAB — CBC
Hematocrit: 41 % (ref 34.0–46.6)
Hemoglobin: 13.4 g/dL (ref 11.1–15.9)
MCH: 27.7 pg (ref 26.6–33.0)
MCHC: 32.7 g/dL (ref 31.5–35.7)
MCV: 85 fL (ref 79–97)
Platelets: 327 10*3/uL (ref 150–450)
RBC: 4.84 x10E6/uL (ref 3.77–5.28)
RDW: 13 % (ref 11.7–15.4)
WBC: 7.8 10*3/uL (ref 3.4–10.8)

## 2023-01-24 LAB — VITAMIN D 25 HYDROXY (VIT D DEFICIENCY, FRACTURES): Vit D, 25-Hydroxy: 82.3 ng/mL (ref 30.0–100.0)

## 2023-01-24 LAB — ADALIMUMAB+AB (SERIAL MONITOR)
Adalimumab Drug Level: 7 ug/mL
Anti-Adalimumab Antibody: <25 ng/mL

## 2023-01-24 LAB — C-REACTIVE PROTEIN: CRP: <1 mg/L (ref 0–10)

## 2023-01-24 LAB — FERRITIN: Ferritin: 16 ng/mL (ref 15–150)

## 2023-01-24 LAB — SERIAL MONITORING

## 2023-02-12 ENCOUNTER — Other Ambulatory Visit: Payer: Self-pay | Admitting: Family Medicine

## 2023-02-13 NOTE — Telephone Encounter (Signed)
Unable to refill per protocol, Rx expired. Discontinued 03/20/22.  Requested Prescriptions  Pending Prescriptions Disp Refills   fluticasone (FLONASE) 50 MCG/ACT nasal spray [Pharmacy Med Name: FLUTICASONE PROP 50 MCG SPRAY] 16 mL 2    Sig: PLACE 1 SPRAY INTO BOTH NOSTRILS 2 (TWO) TIMES DAILY     There is no refill protocol information for this order

## 2023-02-14 ENCOUNTER — Ambulatory Visit: Payer: BC Managed Care – PPO | Admitting: Cardiology

## 2023-02-18 ENCOUNTER — Ambulatory Visit: Payer: BC Managed Care – PPO | Admitting: Cardiology

## 2023-02-18 NOTE — Progress Notes (Deleted)
Clinical Summary Ms. Westover is a 47 y.o.female  1.Palpitations - recent episodes of heart racing with associated SOB, dizziness - no specific trigger - no significant caffeine or EtOH intake.  - can wake up in the middle of the night feeling heart racing - normal TSH, K by recent labs    04/2022 monitor: rare ectopy, no significant arrhythmias. Avg HR 88  -discussed trying lopressor for symptoms, symptoms were not that bad and patient favored not starting medication.       2. Dizziness - urgent care visit 02/25/22 - reported ear fullness, ringing, dizzy spells - room spins with turning head  3. History of Crohn's disease - followed by GI     SH: 28 yo daughter, 76 yo son, 87 son. Past Medical History:  Diagnosis Date   Anxiety 03/04/2013   Arthritis    rheumatoid   Crohn's disease of colon (HCC)    Dermoid cyst of eyebrow 10/2015   left   Fibroadenoma of right breast 02/27/2017   Headache, menstrual migraine      Allergies  Allergen Reactions   Penicillins Hives          Current Outpatient Medications  Medication Sig Dispense Refill   B Complex Vitamins (B COMPLEX PO) Place 1 drop under the tongue daily.     baclofen (LIORESAL) 10 MG tablet Take 1/2-1 tab up to 3 times a day as needed for headaches and neck pain 60 each 6   busPIRone (BUSPAR) 5 MG tablet Take 1 tablet (5 mg total) by mouth 2 (two) times daily. 60 tablet 12   CALCIUM PO Take 1 tablet by mouth daily.     Cholecalciferol 125 MCG (5000 UT) capsule Take 1 capsule (5,000 Units total) by mouth daily.     hydrOXYzine (ATARAX) 10 MG tablet Take 1 tablet (10 mg total) by mouth 3 (three) times daily as needed. 30 tablet 3   HYRIMOZ 40 MG/0.4ML SOAJ Inject into the skin. 40mg  every 14 days     montelukast (SINGULAIR) 10 MG tablet Take 10 mg by mouth at bedtime.     Multiple Vitamins-Minerals (MULTI ADULT GUMMIES PO) Take 1 each by mouth daily.     pantoprazole (PROTONIX) 40 MG tablet TAKE 1  TABLET BY MOUTH EVERY DAY 90 tablet 00   polyethylene glycol (MIRALAX / GLYCOLAX) 17 g packet Take 17 g by mouth daily as needed for moderate constipation.     polyethylene glycol (MIRALAX / GLYCOLAX) 17 g packet Take 17 g by mouth 2 (two) times daily. 60 packet 2   Probiotic Product (PROBIOTIC PO) Take 1 capsule by mouth daily.     SUMAtriptan (IMITREX) 50 MG tablet Take 1 tablet (50 mg total) by mouth every 2 (two) hours as needed for migraine. May repeat in 2 hours if headache persists or recurs. 10 tablet 6   topiramate (TOPAMAX) 25 MG tablet Take 25 mg (1 pill) at bedtime for one week, then increase to 50 mg (2 pills) at bedtime for one week, then take 75 mg (3 pills) at bedtime for one week, then take 100 mg (4 pills) at bedtime and continue on this dose 120 tablet 6   No current facility-administered medications for this visit.     Past Surgical History:  Procedure Laterality Date   BIOPSY  05/24/2022   Procedure: BIOPSY;  Surgeon: Dolores Frame, MD;  [facility];  Service: Gastroenterology;;   COLONOSCOPY  06/19/2005   COLONOSCOPY  N/A 08/10/2015   Procedure: COLONOSCOPY;  Surgeon: Malissa Hippo, MD;  [facility];  Service: Endoscopy;  Laterality: N/A;  1:25   COLONOSCOPY WITH PROPOFOL N/A 05/24/2022   Procedure: COLONOSCOPY WITH PROPOFOL;  Surgeon: Dolores Frame, MD;  [facility];  Service: Gastroenterology;  Laterality: N/A;  130 ASA 2   ESOPHAGOGASTRODUODENOSCOPY (EGD) WITH PROPOFOL N/A 05/24/2022   Procedure: ESOPHAGOGASTRODUODENOSCOPY (EGD) WITH PROPOFOL;  Surgeon: Dolores Frame, MD;  [facility];  Service: Gastroenterology;  Laterality: N/A;   EYE SURGERY     benign tumor   MASS EXCISION Left 10/27/2015   Procedure: EXCISION MASS LEFT BROW ( EXCISION OF SUBMUSCULAR MASS LEFT BROW 2.5CM);  Surgeon: Glenna Fellows, MD;  Location: [facility];  Service: Plastics;  Laterality: Left;      Allergies  Allergen Reactions   Penicillins Hives           Family History  Problem Relation Age of Onset   Diabetes Maternal Grandmother    Hypertension Maternal Grandmother    Kidney failure Maternal Grandmother    Diabetes Maternal Grandfather    Cirrhosis Father    Cancer Father        stomach and bone   Atrial fibrillation Mother    Cancer Maternal Aunt        lung   Cancer Maternal Uncle        bones     Social History Ms. Akin reports that she has never smoked. She has never been exposed to tobacco smoke. She has never used smokeless tobacco. Ms. Johannes reports no history of alcohol use.   Review of Systems CONSTITUTIONAL: No weight loss, fever, chills, weakness or fatigue.  HEENT: Eyes: No visual loss, blurred vision, double vision or yellow sclerae.No hearing loss, sneezing, congestion, runny nose or sore throat.  SKIN: No rash or itching.  CARDIOVASCULAR:  RESPIRATORY: No shortness of breath, cough or sputum.  GASTROINTESTINAL: No anorexia, nausea, vomiting or diarrhea. No abdominal pain or blood.  GENITOURINARY: No burning on urination, no polyuria NEUROLOGICAL: No headache, dizziness, syncope, paralysis, ataxia, numbness or tingling in the extremities. No change in bowel or bladder control.  MUSCULOSKELETAL: No muscle, back pain, joint pain or stiffness.  LYMPHATICS: No enlarged nodes. No history of splenectomy.  PSYCHIATRIC: No history of depression or anxiety.  ENDOCRINOLOGIC: No reports of sweating, cold or heat intolerance. No polyuria or polydipsia.  Marland Kitchen   Physical Examination There were no vitals filed for this visit. There were no vitals filed for this visit.  Gen: resting comfortably, no acute distress HEENT: no scleral icterus, pupils equal round and reactive, no palptable cervical adenopathy,  CV Resp: Clear to auscultation bilaterally GI: abdomen is soft, non-tender, non-distended, normal bowel sounds, no hepatosplenomegaly MSK:  extremities are warm, no edema.  Skin: warm, no rash Neuro:  no focal deficits Psych: appropriate affect   Diagnostic Studies  04/2022 monitor 14 day monitor   Rare ventricular ectopy in the form of isolated PACs, couplets   Rare ventricular ectopy in the form of isolated PVCs   Reported symptoms correlated with normal sinus rhythm   No significant arrhythmias   Assessment and Plan   1.Palpitations - EKG today shows NSR - we will paln for 14 day zio patch for further evaluation     Antoine Poche, M.D., F.A.C.C.

## 2023-03-09 ENCOUNTER — Other Ambulatory Visit (INDEPENDENT_AMBULATORY_CARE_PROVIDER_SITE_OTHER): Payer: Self-pay | Admitting: Gastroenterology

## 2023-03-21 ENCOUNTER — Telehealth (INDEPENDENT_AMBULATORY_CARE_PROVIDER_SITE_OTHER): Payer: Self-pay | Admitting: Gastroenterology

## 2023-03-21 ENCOUNTER — Ambulatory Visit: Payer: BC Managed Care – PPO | Admitting: Cardiology

## 2023-03-21 NOTE — Telephone Encounter (Signed)
Left message to return call 

## 2023-03-21 NOTE — Telephone Encounter (Signed)
Left detailed message for patient to see if she could do her TCS on 03/28/23 at 8:30am. Will wait for call back.

## 2023-04-02 ENCOUNTER — Other Ambulatory Visit: Payer: Self-pay | Admitting: Adult Health

## 2023-04-03 ENCOUNTER — Encounter: Payer: Self-pay | Admitting: Adult Health

## 2023-04-03 ENCOUNTER — Ambulatory Visit: Payer: BC Managed Care – PPO | Admitting: Adult Health

## 2023-04-03 VITALS — BP 132/83 | HR 80 | Ht 65.0 in | Wt 150.0 lb

## 2023-04-03 DIAGNOSIS — G44229 Chronic tension-type headache, not intractable: Secondary | ICD-10-CM

## 2023-04-03 DIAGNOSIS — G43109 Migraine with aura, not intractable, without status migrainosus: Secondary | ICD-10-CM

## 2023-04-03 MED ORDER — BACLOFEN 5 MG PO TABS: 5.0000 mg | ORAL_TABLET | Freq: Two times a day (BID) | ORAL | 5 refills | Status: DC | PRN

## 2023-04-03 MED ORDER — RIZATRIPTAN BENZOATE 10 MG PO TBDP: 10.0000 mg | ORAL_TABLET | ORAL | 11 refills | Status: DC | PRN

## 2023-04-03 MED ORDER — ONDANSETRON HCL 8 MG PO TABS: 8.0000 mg | ORAL_TABLET | Freq: Three times a day (TID) | ORAL | 11 refills | Status: DC | PRN

## 2023-04-03 MED ORDER — PROPRANOLOL HCL 20 MG PO TABS: 20.0000 mg | ORAL_TABLET | Freq: Two times a day (BID) | ORAL | 5 refills | Status: DC

## 2023-04-03 NOTE — Patient Instructions (Addendum)
Your Plan:  Start propranolol 20mg  twice daily - please call after 1 week if no benefit for dosage increase or sooner if difficulty tolerating. Please monitor blood pressure and heart rate at home  If this medication does not work, we can consider trying amitriptyline but you will need to stop hydroxyzine due to interaction  Try rizatriptan to take at onset of visual symptoms to prevent migraine headache, can repeat after 2 hours if needed  Try Zofran as needed for nausea associated with migraine  Continue baclofen as needed for migraines - will send in 5mg  tablets      Follow up in 6 months or call earlier if needed       Thank you for coming to see [place] at [facility]. I hope we have been able to provide you high quality care today.  You may receive a patient satisfaction survey over the next few weeks. We would appreciate your feedback and comments so that we may continue to improve ourselves and the health of our patients.

## 2023-04-03 NOTE — Telephone Encounter (Signed)
LMOVM to call back. Letter mailed. ?

## 2023-04-03 NOTE — Progress Notes (Signed)
Referring:  Adline Potter, NP [address] [place],  [place] [address]  PCP: Adline Potter, NP   CC:  headaches  History provided from self  Follow-up visit:  Prior visit: 09/26/2022 Dr. Delena Bali  Brief HPI:   Mauri Reading Weger is a 47 y.o. female with medical comorbidities of Crohn's disease, rheumatoid arthritis, migraine headaches and anxiety who was evaluated by Dr. Delena Bali on 09/26/2022 for new daily persistent headaches which began in 02/2022 felt to be more tension related headaches.  Denies any recent migraine headaches.  At prior visit, recommended restarted off topiramate as previously effective and tolerated well and started on baclofen as needed. MRI brain unremarkable.   Interval history:  Returns for follow-up visit.  Remains on topiramate for 1 month but discontinued due to hand and feet numbness which resolved after stopping.  Reports improvement of daily tension type headaches with occurring only on occasion but started to have migrainous type headaches since August, will have onset of lines in vision for about 30 minutes then will develop a sharp pain usually on the right parietal area associated with photophobia, phonophobia and nausea, at times will have visual aura without migraine headache, will lay down with resolution of migraine, intolerant to sumatriptan. Use of baclofen for tension type headaches with benefit but has difficulty tolerating 10 mg dose.  Reports about 2 episodes of just visual aura and 2 visual auras with migraines in both August and September. Does have hx of migraines with visual aura but has not reoccurred since she was pregnant with her daughter who is now 56 yo. She does question hormone related with perimenopause    Headache History: Onset: Tension type 02/2022, reoccurrence of migraines 02/2023 Triggers: stress, working on a computer, bright lights Aura: now with visual aura Location: vertex, parietal Associated  Symptoms:  Photophobia: yes  Phonophobia: yes  Nausea: yes Worse with activity?: yes Duration of headaches: hours  Migraine days per month: 3-4   Current Treatment: Abortive Baclofen  Preventative none  Prior Therapies                                 Tylenol Flexeril 10 mg TID PRN - drowsiness Buspirone Topamax -intolerant Imitrex  Baclofen      LABS: CBC    Component Value Date/Time   WBC 7.8 01/17/2023 0938   WBC 10.6 (H) 04/09/2022 0041   RBC 4.84 01/17/2023 0938   RBC 4.55 04/09/2022 0041   HGB 13.4 01/17/2023 0938   HCT 41.0 01/17/2023 0938   PLT 327 01/17/2023 0938   MCV 85 01/17/2023 0938   MCH 27.7 01/17/2023 0938   MCH 29.0 04/09/2022 0041   MCHC 32.7 01/17/2023 0938   MCHC 33.4 04/09/2022 0041   RDW 13.0 01/17/2023 0938   LYMPHSABS 3.0 10/09/2021 1611   MONOABS 630 03/13/2016 1619   EOSABS 0.2 10/09/2021 1611   BASOSABS 0.1 10/09/2021 1611      Latest Ref Rng & Units 01/17/2023    9:38 AM 09/17/2022   11:51 AM 04/09/2022   12:41 AM  CMP  Glucose 70 - 99 mg/dL 95  95  725   BUN 6 - 24 mg/dL 13  11  15    Creatinine 0.57 - 1.00 mg/dL 3.66  4.40  3.47   Sodium 134 - 144 mmol/L 139  140  137   Potassium 3.5 - 5.2 mmol/L 4.8  4.5  4.5  Chloride 96 - 106 mmol/L 100  102  106   CO2 20 - 29 mmol/L 22  24  22    Calcium 8.7 - 10.2 mg/dL 9.8  9.6  9.4   Total Protein 6.0 - 8.5 g/dL 8.0  7.1    Total Bilirubin 0.0 - 1.2 mg/dL 0.5  0.2    Alkaline Phos 44 - 121 IU/L 94  88    AST 0 - 40 IU/L 18  15    ALT 0 - 32 IU/L 13  13       IMAGING:  CTH 2003 wnl   Current Outpatient Medications on File Prior to Visit  Medication Sig Dispense Refill   B Complex Vitamins (B COMPLEX PO) Place 1 drop under the tongue daily.     busPIRone (BUSPAR) 5 MG tablet TAKE 1 TABLET BY MOUTH TWICE A DAY 180 tablet 5   CALCIUM PO Take 1 tablet by mouth daily.     Cholecalciferol 125 MCG (5000 UT) capsule Take 1 capsule (5,000 Units total) by mouth daily.      hydrOXYzine (ATARAX) 10 MG tablet Take 1 tablet (10 mg total) by mouth 3 (three) times daily as needed. 30 tablet 3   HYRIMOZ 40 MG/0.4ML SOAJ Inject into the skin. 40mg  every 14 days     Multiple Vitamins-Minerals (MULTI ADULT GUMMIES PO) Take 1 each by mouth daily.     pantoprazole (PROTONIX) 40 MG tablet TAKE 1 TABLET BY MOUTH EVERY DAY 90 tablet 00   polyethylene glycol (MIRALAX / GLYCOLAX) 17 g packet Take 17 g by mouth daily as needed for moderate constipation.     polyethylene glycol (MIRALAX / GLYCOLAX) 17 g packet Take 17 g by mouth 2 (two) times daily. 60 packet 2   Probiotic Product (PROBIOTIC PO) Take 1 capsule by mouth daily.     No current facility-administered medications on file prior to visit.     Allergies: Allergies  Allergen Reactions   Penicillins Hives         Family History: Migraine or other headaches in the family:  no Aneurysms in a first degree relative:  no Brain tumors in the family:  no Other neurological illness in the family:   no  Past Medical History: Past Medical History:  Diagnosis Date   Anxiety 03/04/2013   Arthritis    rheumatoid   Crohn's disease of colon (HCC)    Dermoid cyst of eyebrow 10/2015   left   Fibroadenoma of right breast 02/27/2017   Headache, menstrual migraine     Past Surgical History Past Surgical History:  Procedure Laterality Date   BIOPSY  05/24/2022   Procedure: BIOPSY;  Surgeon: Dolores Frame, MD;  [facility];  Service: Gastroenterology;;   COLONOSCOPY  06/19/2005   COLONOSCOPY N/A 08/10/2015   Procedure: COLONOSCOPY;  Surgeon: Malissa Hippo, MD;  [facility];  Service: Endoscopy;  Laterality: N/A;  1:25   COLONOSCOPY WITH PROPOFOL N/A 05/24/2022   Procedure: COLONOSCOPY WITH PROPOFOL;  Surgeon: Dolores Frame, MD;  [facility];  Service: Gastroenterology;  Laterality: N/A;  130 ASA 2   ESOPHAGOGASTRODUODENOSCOPY (EGD) WITH PROPOFOL N/A 05/24/2022    Procedure: ESOPHAGOGASTRODUODENOSCOPY (EGD) WITH PROPOFOL;  Surgeon: Dolores Frame, MD;  [facility];  Service: Gastroenterology;  Laterality: N/A;   EYE SURGERY     benign tumor   MASS EXCISION Left 10/27/2015   Procedure: EXCISION MASS LEFT BROW ( EXCISION OF SUBMUSCULAR MASS LEFT  BROW 2.5CM);  Surgeon: Glenna Fellows, MD;  Location: [facility];  Service: Plastics;  Laterality: Left;    Social History: Social History   Tobacco Use   Smoking status: Never    Passive exposure: Never   Smokeless tobacco: Never  Vaping Use   Vaping status: Never Used  Substance Use Topics   Alcohol use: No    Alcohol/week: 0.0 standard drinks of alcohol   Drug use: No     ROS: Negative for fevers, chills. Positive for headaches. All other systems reviewed and negative unless stated otherwise in HPI.   Physical Exam:   Vital Signs: BP 132/83   Pulse 80   Ht 5\' 5"  (1.651 m)   Wt 150 lb (68 kg)   BMI 24.96 kg/m  GENERAL: well appearing,in no acute distress,alert SKIN:  Color, texture, turgor normal. No rashes or lesions HEAD:  Normocephalic/atraumatic. CV:  RRR RESP: Normal respiratory effort  NEUROLOGICAL: Mental Status: Alert, oriented to person, place and time,Follows commands Cranial Nerves: PERRL, visual fields intact to confrontation, extraocular movements intact, facial sensation intact, no facial droop or ptosis, hearing grossly intact, no dysarthria Motor: muscle strength 5/5 both upper and lower extremitiese Reflexes: 2+ throughout Sensation: intact to light touch all 4 extremities Coordination: Finger-to- nose-finger intact bilaterally Gait: normal-based     IMPRESSION: 47 year old female with a history of Crohn's disease, rheumatoid arthritis, anxiety and migraines with visual auras in her 30's who presents for follow-up of worsening daily headaches which began in August 2023, most consistent with tension type headaches.  MRI  brain unremarkable.  Tension type headaches have improved now occurring only on occasion but started to have reoccurrence of migraines with visual aura and at times just visual aura without migraine in August, possibly due to hormonal change with perimenopause. Intolerant to topiramate and sumatriptan.  Baclofen with benefit but difficulty tolerating 10 mg dose.    PLAN: -Prevention: Start propranolol 20mg  BID, discussed potential side effects, advised to monitor BP and HR at home, call after 1-2 weeks for dosage increase if needed or sooner if occultly tolerating -Rescue: Start rizatriptan 10mg  as needed, can repeat after 2 hrs if needed. Use of Zofran as needed. Continue baclofen, will lower dosage to 5mg  tablets as needed -Next steps: consider amitriptyline but will need to stop hydroxyzine (takes only on occasion for anxiety), consider CGRP or botox   Follow-up in 6 months or call earlier if needed     I spent 40 minutes of face-to-face and non-face-to-face time with patient.  This included previsit chart review, lab review, study review, order entry, electronic health record documentation, patient education and discussion regarding above diagnoses and treatment plan and answered all other questions to patient's satisfaction  Ihor Austin, [facility]  [facility] [address] [place], [place] [address]  Phone [phone] Fax [phone] Note: This document was prepared with digital dictation and possible smart phrase technology. Any transcriptional errors that result from this process are unintentional.

## 2023-04-07 ENCOUNTER — Ambulatory Visit: Payer: BC Managed Care – PPO | Admitting: Cardiology

## 2023-04-22 ENCOUNTER — Ambulatory Visit (INDEPENDENT_AMBULATORY_CARE_PROVIDER_SITE_OTHER): Payer: BC Managed Care – PPO | Admitting: Gastroenterology

## 2023-04-22 ENCOUNTER — Encounter (INDEPENDENT_AMBULATORY_CARE_PROVIDER_SITE_OTHER): Payer: Self-pay | Admitting: Gastroenterology

## 2023-04-22 VITALS — BP 115/56 | HR 61 | Temp 98.4°F | Ht 65.0 in | Wt 152.3 lb

## 2023-04-22 DIAGNOSIS — K5080 Crohn's disease of both small and large intestine without complications: Secondary | ICD-10-CM | POA: Diagnosis not present

## 2023-04-22 NOTE — Progress Notes (Unsigned)
Referring Provider: Adline Potter, NP Primary Care Physician:  Adline Potter, NP Primary GI Physician: Dr. Tasia Catchings   Chief Complaint  Patient presents with   Crohn's Disease    Follow up on Crohns. Hymiroz was not working as well as Lobbyist. RA dr gave sample of humira she has been doing every 2 weeks. Has not had a dose in 3 weeks. Would like to discuss alternative options.    HPI:   Tamara Martin is a 47 y.o. female with past medical history of ileocolonic Crohn's disease   Patient presenting today for follow up of Crohn's disease  Last seen July 2024, at that time having constipation, worse recently, no BMs in 5 days. feeling a bulge in her rectum she had to manually reduce. Reported compliance with Hyrimoz 40mg  Q2w.   Recommended inflammatory markers, humira drug levels, supplement vitamin d   CRP was normal, humira levels 7, ideally should be >7.5   Present: Switched over to Hyrimoz in early 2024, she notes that she was doing well on Humira, at her last visit she was very constipated, requiring a bowel cleanse which helped and she has had to continue on miralax thereafter. She notes that arthritis was also flaring up a bit more. She states that she stopped her hyrimoz a few weeks ago, was due for last injection 2 weeks ago. She is feeling pretty good right now, having 1 BM daily. She denies rectal bleeding or melena. She states that her rheumatologist gave her some samples of humira for a short time and she felt pretty good on those, they even tried to appeal to her insurance to get her back on brand name humira but insurance denied. She is changing insurance to [place] from [place] in January.    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 - Normal stomach. Biopsied.   Last EGD: 05/2022- The examined portion of the ileum was normal. Biopsied. - The entire examined colon is normal. Biopsied. - Non- bleeding internal hemorrhoids.    A. SMALL BOWEL, BIOPSY:  Benign duodenal mucosa with no diagnostic abnormality   B. GASTRIC BIOPSY:  Reactive gastropathy and mild chronic gastritis  Negative for H. pylori, intestinal metaplasia, dysplasia and carcinoma   C. TERMINAL, ILEUM, BIOPSY:  Benign ileal mucosa with no diagnostic abnormality   D. CECUM AND ASCENDING COLON, BIOPSY:  Benign colonic mucosa with no diagnostic abnormality   E. TRANSVERSE COLON, BIOPSY:  Benign colonic mucosa with no diagnostic abnormality   F. SIGMOID AND DESCENDING COLON, BIOPSY:  Benign colonic mucosa with no diagnostic abnormality   G. RECTAL BIOPSY:  Benign colonic mucosa with no diagnostic abnormality     Recommendations:    Past Medical History:  Diagnosis Date   Anxiety 03/04/2013   Arthritis    rheumatoid   Crohn's disease of colon (HCC)    Dermoid cyst of eyebrow 10/2015   left   Fibroadenoma of right breast 02/27/2017   Headache, menstrual migraine     Past Surgical History:  Procedure Laterality Date   BIOPSY  05/24/2022   Procedure: BIOPSY;  Surgeon: Dolores Frame, MD;  [facility];  Service: Gastroenterology;;   COLONOSCOPY  06/19/2005   COLONOSCOPY N/A 08/10/2015   Procedure: COLONOSCOPY;  Surgeon: Malissa Hippo, MD;  [facility];  Service: Endoscopy;  Laterality: N/A;  1:25   COLONOSCOPY WITH PROPOFOL N/A 05/24/2022   Procedure: COLONOSCOPY WITH PROPOFOL;  Surgeon: Dolores Frame, MD;  [facility];  Service: Gastroenterology;  Laterality: N/A;  130 ASA 2   ESOPHAGOGASTRODUODENOSCOPY (EGD) WITH PROPOFOL  N/A 05/24/2022   Procedure: ESOPHAGOGASTRODUODENOSCOPY (EGD) WITH PROPOFOL;  Surgeon: Dolores Frame, MD;  [facility];  Service: Gastroenterology;  Laterality: N/A;   EYE SURGERY     benign tumor   MASS EXCISION Left 10/27/2015   Procedure: EXCISION MASS LEFT BROW ( EXCISION OF SUBMUSCULAR MASS LEFT BROW 2.5CM);  Surgeon: Glenna Fellows, MD;  Location: [facility];  Service: Plastics;  Laterality: Left;    Current Outpatient Medications  Medication Sig Dispense Refill   adalimumab (HUMIRA, 2 PEN,) 40 MG/0.4ML pen Inject into the skin. Takes every 2 weeks.     B Complex Vitamins (B COMPLEX PO) Place 1 drop under the tongue daily.     Baclofen 5 MG TABS Take 1 tablet (5 mg total) by mouth 2 (two) times daily as needed (for tension headache). 60 tablet 5   busPIRone (BUSPAR) 5 MG tablet TAKE 1 TABLET BY MOUTH TWICE A DAY 180 tablet 5   CALCIUM PO Take 1 tablet by mouth daily.     Cholecalciferol 125 MCG (5000 UT) capsule Take 1 capsule (5,000 Units total) by mouth daily. (Patient taking differently: Take 5,000 Units by mouth daily. Takes twice a week)     hydrOXYzine (ATARAX) 10 MG tablet Take 1 tablet (10 mg total) by mouth 3 (three) times daily as needed. 30 tablet 3   Multiple Vitamins-Minerals (MULTI ADULT GUMMIES PO) Take 1 each by mouth daily.     pantoprazole (PROTONIX) 40 MG tablet TAKE 1 TABLET BY MOUTH EVERY DAY 90 tablet 00   polyethylene glycol (MIRALAX / GLYCOLAX) 17 g packet Take 17 g by mouth daily as needed for moderate constipation.     Probiotic Product (PROBIOTIC PO) Take 1 capsule by mouth daily.     propranolol (INDERAL) 20 MG tablet Take 1 tablet (20 mg total) by mouth 2 (two) times daily. 60 tablet 5   rizatriptan (MAXALT-MLT) 10 MG disintegrating tablet Take 1 tablet (10 mg total) by mouth as needed for migraine. May repeat in 2 hours if needed 9 tablet 11   HYRIMOZ 40 MG/0.4ML SOAJ Inject into the skin. 40mg  every 14 days (Patient  not taking: Reported on 04/22/2023)     ondansetron (ZOFRAN) 8 MG tablet Take 1 tablet (8 mg total) by mouth every 8 (eight) hours as needed for nausea or vomiting (associated with migraine). (Patient not taking: Reported on 04/22/2023) 20 tablet 11   No current facility-administered medications for this visit.    Allergies as of 04/22/2023 - Review Complete 04/03/2023  Allergen Reaction Noted   Penicillins Hives 05/03/2011    Family History  Problem Relation Age of Onset   Diabetes Maternal Grandmother    Hypertension Maternal Grandmother    Kidney failure Maternal Grandmother    Diabetes Maternal Grandfather    Cirrhosis Father    Cancer Father        stomach and bone   Atrial fibrillation Mother    Cancer Maternal Aunt        lung   Cancer Maternal  Uncle        bones    Social History   Socioeconomic History   Marital status: Married    Spouse name: Not on file   Number of children: Not on file   Years of education: Not on file   Highest education level: Not on file  Occupational History   Not on file  Tobacco Use   Smoking status: Never    Passive exposure: Never   Smokeless tobacco: Never  Vaping Use   Vaping status: Never Used  Substance and Sexual Activity   Alcohol use: No    Alcohol/week: 0.0 standard drinks of alcohol   Drug use: No   Sexual activity: Yes    Birth control/protection: Other-see comments    Comment: vasectomy  Other Topics Concern   Not on file  Social History Narrative   Left Handed   No Caffeine    Social Determinants of Health   Financial Resource Strain: Low Risk  (05/01/2022)   Overall Financial Resource Strain (CARDIA)    Difficulty of Paying Living Expenses: Not very hard  Food Insecurity: No Food Insecurity (05/01/2022)   Hunger Vital Sign    Worried About Running Out of Food in the Last Year: Never true    Ran Out of Food in the Last Year: Never true  Transportation Needs: No Transportation Needs (05/01/2022)    PRAPARE - Administrator, Civil Service (Medical): No    Lack of Transportation (Non-Medical): No  Physical Activity: Inactive (05/01/2022)   Exercise Vital Sign    Days of Exercise per Week: 0 days    Minutes of Exercise per Session: 0 min  Stress: No Stress Concern Present (05/01/2022)   [employer] of Occupational Health - Occupational Stress Questionnaire    Feeling of Stress : Only a little  Social Connections: Moderately Integrated (05/01/2022)   Social Connection and Isolation Panel [NHANES]    Frequency of Communication with Friends and Family: Three times a week    Frequency of Social Gatherings with Friends and Family: Once a week    Attends Religious Services: More than 4 times per year    Active Member of [employer] or Organizations: No    Attends Engineer, structural: Never    Marital Status: Married    Review of systems General: negative for malaise, night sweats, fever, chills, weight los Neck: Negative for lumps, goiter, pain and significant neck swelling Resp: Negative for cough, wheezing, dyspnea at rest CV: Negative for chest pain, leg swelling, palpitations, orthopnea GI: denies melena, hematochezia, nausea, vomiting, diarrhea, constipation, dysphagia, odyonophagia, early satiety or unintentional weight loss.  MSK: Negative for joint pain or swelling, back pain, and muscle pain. Derm: Negative for itching or rash Psych: Denies depression, anxiety, memory loss, confusion. No homicidal or suicidal ideation.  Heme: Negative for prolonged bleeding, bruising easily, and swollen nodes. Endocrine: Negative for cold or heat intolerance, polyuria, polydipsia and goiter. Neuro: negative for tremor, gait imbalance, syncope and seizures. The remainder of the review of systems is noncontributory.  Physical Exam: There were no vitals taken for this visit. General:   Alert and oriented. No distress noted. Pleasant and cooperative.  Head:  Normocephalic  and atraumatic. Eyes:  Conjuctiva clear without scleral icterus. Mouth:  Oral mucosa pink and moist. Good dentition. No lesions. Heart: Normal rate and rhythm, s1 and s2 heart sounds present.  Lungs: Clear lung sounds in all lobes. Respirations equal and unlabored. Abdomen:  +BS, soft, non-tender and  non-distended. No rebound or guarding. No HSM or masses noted. Derm: No palmar erythema or jaundice Msk:  Symmetrical without gross deformities. Normal posture. Extremities:  Without edema. Neurologic:  Alert and  oriented x4 Psych:  Alert and cooperative. Normal mood and affect.  Invalid input(s): "6 MONTHS"   ASSESSMENT: Tamara Martin is a 47 y.o. female presenting today    PLAN:  calprotectin, CRP 2. Discuss colonoscopy vs. Increased hyrimoz 3. Continue miralax for now.   Follow Up: ***  Azula Zappia L. Jeanmarie Hubert, MSN, APRN, AGNP-C Adult-Gerontology Nurse Practitioner [facility]

## 2023-04-22 NOTE — Patient Instructions (Signed)
We will check inflammatory markers today I will discuss proceeding with colonoscopy vs. Increasing dose of Hyrimoz with Dr. Tasia Catchings Continue miralax for now  Follow up 3 months  It was a pleasure to see you today. I want to create trusting relationships with patients and provide genuine, compassionate, and quality care. I truly value your feedback! please be on the lookout for a survey regarding your visit with me today. I appreciate your input about our visit and your time in completing this!    Gustave Lindeman L. Jeanmarie Hubert, MSN, APRN, AGNP-C Adult-Gerontology Nurse Practitioner [facility] at [facility]

## 2023-04-23 LAB — C-REACTIVE PROTEIN: CRP: <1 mg/L (ref 0–10)

## 2023-04-23 MED ORDER — HYRIMOZ 40 MG/0.4ML ~~LOC~~ SOAJ: 40.0000 mg | SUBCUTANEOUS | 5 refills | Status: DC

## 2023-04-29 ENCOUNTER — Ambulatory Visit (INDEPENDENT_AMBULATORY_CARE_PROVIDER_SITE_OTHER): Payer: BC Managed Care – PPO | Admitting: Gastroenterology

## 2023-05-01 ENCOUNTER — Telehealth (INDEPENDENT_AMBULATORY_CARE_PROVIDER_SITE_OTHER): Payer: Self-pay

## 2023-05-01 NOTE — Telephone Encounter (Signed)
Date: 05/01/2023 Tamara Martin [address] [place], [place] [address] Plan Member Name: RENAYE Martin Plan Member ID:[id number] Prescriber Name: Doylene Bode Prescriber Phone: [phone] Prescriber Fax: [phone] Plan-approved Criteria used for review: Humira and biosimilars PDPD SGM [name]: [facility] received a request from your prescriber for coverage of Hyrimoz. As long as you remain covered by the [facility] and there are no changes to your plan benefits, this request is approved for the following time period: 05/01/2023 - 04/30/2024 Approvals may be subject to dosing limits in accordance with FDA approved labeling, evidence-based practice guidelines or your pharmacy plan benefits. You will need to fill your specialty medications at a select participating pharmacy in your plan's network. Sincerely, [facility] PA# [facility] Plan [id number] SP [facility] is the pharmacy benefit manager administering the prescription drug benefits on behalf of the health plan or plan sponsor. This document contains references to brand-name prescription drugs that are trademarks or registered trademarks of pharmaceutical manufacturers not affiliated with [facility]. [id number] TDD/TTY: [phone] Other Languages and Formats If you need this document in a different language or format, need someone to read the letter to you, or need help understanding the letter, by contacting us at the number on your [facility] Plan ID card. Spanish: Si usted necesita asistencia o necesita hablar con alguien en Espaol, por favor llame al nmero gratuito de [facility] [place] ubicado en su tarjeta de identificacin de beneficios

## 2023-05-09 LAB — CALPROTECTIN, FECAL: Calprotectin, Fecal: <5 ug/g (ref 0–120)

## 2023-07-11 ENCOUNTER — Other Ambulatory Visit: Payer: Self-pay | Admitting: Family Medicine

## 2023-07-28 ENCOUNTER — Ambulatory Visit (INDEPENDENT_AMBULATORY_CARE_PROVIDER_SITE_OTHER): Payer: BC Managed Care – PPO | Admitting: Gastroenterology

## 2023-08-21 ENCOUNTER — Ambulatory Visit (INDEPENDENT_AMBULATORY_CARE_PROVIDER_SITE_OTHER): Payer: 59 | Admitting: Gastroenterology

## 2023-10-02 ENCOUNTER — Telehealth: Payer: BC Managed Care – PPO | Admitting: Adult Health

## 2023-10-02 ENCOUNTER — Encounter: Payer: Self-pay | Admitting: Adult Health

## 2023-10-02 DIAGNOSIS — G44229 Chronic tension-type headache, not intractable: Secondary | ICD-10-CM

## 2023-10-02 DIAGNOSIS — G44209 Tension-type headache, unspecified, not intractable: Secondary | ICD-10-CM

## 2023-10-02 DIAGNOSIS — G43109 Migraine with aura, not intractable, without status migrainosus: Secondary | ICD-10-CM | POA: Diagnosis not present

## 2023-10-02 MED ORDER — BACLOFEN 5 MG PO TABS: 5.0000 mg | ORAL_TABLET | Freq: Two times a day (BID) | ORAL | 5 refills | Status: AC | PRN

## 2023-10-02 MED ORDER — PROPRANOLOL HCL 20 MG PO TABS: 20.0000 mg | ORAL_TABLET | Freq: Two times a day (BID) | ORAL | 5 refills | Status: AC

## 2023-10-02 NOTE — Progress Notes (Signed)
 Referring:  Adline Potter, NP [address] [place],  [place] [address]  PCP: Adline Potter, NP  Virtual Visit via Video Note  I connected with Tamara Martin on 10/02/23 at  4:00 PM EDT by a video enabled telemedicine application and verified that I am speaking with the correct person using two identifiers.  Location: Patient: at home Provider: in office, [facility]   I discussed the limitations of evaluation and management by telemedicine and the availability of in person appointments. The patient expressed understanding and agreed to proceed.   CC:  headaches  History provided from self  Follow-up visit:  Prior visit: 04/03/2023  Brief HPI:   Tamara Martin is a 48 y.o. female with medical comorbidities of Crohn's disease, rheumatoid arthritis, migraine headaches and anxiety who was evaluated by Dr. Delena Bali on 09/26/2022 for new daily persistent headaches which began in 02/2022 felt to be more tension related headaches. MRI brain 10/2022 benign.  At prior visit, noted improvement of daily tension headaches but start to experience migrainous type headaches around 02/2023.  Initiated propranolol 20 mg BID for prevention and rizatriptan for rescue   Interval history:  Returns for 22-month follow-up via MyChart video visit.  She reports great improvement of migraine headaches since initiating propranolol.  She has not experienced any recent visual auras.  She has had a couple migraines since starting but resolved after taking baclofen.  She has not needed rizatriptan.  She has not been experiencing any daily tension headaches.  Tolerating propranolol without side effects.     Headache History: Onset: Tension type 02/2022, reoccurrence of migraines 02/2023 Triggers: stress, working on a computer, bright lights Aura: now with visual aura Location: vertex, parietal Associated Symptoms:  Photophobia: yes  Phonophobia: yes  Nausea: yes Worse with activity?:  yes Duration of headaches: hours  Migraine days per month: 1   Current Treatment: Abortive Rizatriptan Baclofen  Preventative Propranolol 20 mg BID  Prior Therapies                                 Tylenol Flexeril 10 mg TID PRN - drowsiness Buspirone Topamax -intolerant Propranolol Zofran Rizatriptan Imitrex  Baclofen      LABS: CBC    Component Value Date/Time   WBC 7.8 01/17/2023 0938   WBC 10.6 (H) 04/09/2022 0041   RBC 4.84 01/17/2023 0938   RBC 4.55 04/09/2022 0041   HGB 13.4 01/17/2023 0938   HCT 41.0 01/17/2023 0938   PLT 327 01/17/2023 0938   MCV 85 01/17/2023 0938   MCH 27.7 01/17/2023 0938   MCH 29.0 04/09/2022 0041   MCHC 32.7 01/17/2023 0938   MCHC 33.4 04/09/2022 0041   RDW 13.0 01/17/2023 0938   LYMPHSABS 3.0 10/09/2021 1611   MONOABS 630 03/13/2016 1619   EOSABS 0.2 10/09/2021 1611   BASOSABS 0.1 10/09/2021 1611      Latest Ref Rng & Units 01/17/2023    9:38 AM 09/17/2022   11:51 AM 04/09/2022   12:41 AM  CMP  Glucose 70 - 99 mg/dL 95  95  604   BUN 6 - 24 mg/dL 13  11  15    Creatinine 0.57 - 1.00 mg/dL 5.40  9.81  1.91   Sodium 134 - 144 mmol/L 139  140  137   Potassium 3.5 - 5.2 mmol/L 4.8  4.5  4.5   Chloride 96 - 106 mmol/L 100  102  106   CO2 20 - 29 mmol/L 22  24  22    Calcium 8.7 - 10.2 mg/dL 9.8  9.6  9.4   Total Protein 6.0 - 8.5 g/dL 8.0  7.1    Total Bilirubin 0.0 - 1.2 mg/dL 0.5  0.2    Alkaline Phos 44 - 121 IU/L 94  88    AST 0 - 40 IU/L 18  15    ALT 0 - 32 IU/L 13  13       IMAGING:  CTH 2003 wnl   Current Outpatient Medications on File Prior to Visit  Medication Sig Dispense Refill   B Complex Vitamins (B COMPLEX PO) Place 1 drop under the tongue daily.     Baclofen 5 MG TABS Take 1 tablet (5 mg total) by mouth 2 (two) times daily as needed (for tension headache). 60 tablet 5   busPIRone (BUSPAR) 5 MG tablet TAKE 1 TABLET BY MOUTH TWICE A DAY 180 tablet 5   CALCIUM PO Take 1 tablet by mouth daily.      Cholecalciferol 125 MCG (5000 UT) capsule Take 1 capsule (5,000 Units total) by mouth daily. (Patient taking differently: Take 5,000 Units by mouth daily. Takes twice a week)     hydrOXYzine (ATARAX) 10 MG tablet Take 1 tablet (10 mg total) by mouth 3 (three) times daily as needed. 30 tablet 3   HYRIMOZ 40 MG/0.4ML SOAJ Inject 40 mg into the skin every 14 (fourteen) days. 40mg  every 14 days 0.8 mL 5   Multiple Vitamins-Minerals (MULTI ADULT GUMMIES PO) Take 1 each by mouth daily.     ondansetron (ZOFRAN) 8 MG tablet Take 1 tablet (8 mg total) by mouth every 8 (eight) hours as needed for nausea or vomiting (associated with migraine). (Patient not taking: Reported on 04/22/2023) 20 tablet 11   pantoprazole (PROTONIX) 40 MG tablet TAKE 1 TABLET BY MOUTH EVERY DAY 90 tablet 00   polyethylene glycol (MIRALAX / GLYCOLAX) 17 g packet Take 17 g by mouth daily as needed for moderate constipation.     Probiotic Product (PROBIOTIC PO) Take 1 capsule by mouth daily.     propranolol (INDERAL) 20 MG tablet Take 1 tablet (20 mg total) by mouth 2 (two) times daily. 60 tablet 5   rizatriptan (MAXALT-MLT) 10 MG disintegrating tablet Take 1 tablet (10 mg total) by mouth as needed for migraine. May repeat in 2 hours if needed 9 tablet 11   No current facility-administered medications on file prior to visit.     Allergies: Allergies  Allergen Reactions   Penicillins Hives         Family History: Migraine or other headaches in the family:  no Aneurysms in a first degree relative:  no Brain tumors in the family:  no Other neurological illness in the family:   no  Past Medical History: Past Medical History:  Diagnosis Date   Anxiety 03/04/2013   Arthritis    rheumatoid   Crohn's disease of colon (HCC)    Dermoid cyst of eyebrow 10/2015   left   Fibroadenoma of right breast 02/27/2017   Headache, menstrual migraine     Past Surgical History Past Surgical History:  Procedure Laterality Date    BIOPSY  05/24/2022   Procedure: BIOPSY;  Surgeon: Dolores Frame, MD;  [facility];  Service: Gastroenterology;;   COLONOSCOPY  06/19/2005   COLONOSCOPY N/A 08/10/2015   Procedure: COLONOSCOPY;  Surgeon: Malissa Hippo, MD;  [facility];  Service: Endoscopy;  Laterality: N/A;  1:25   COLONOSCOPY WITH PROPOFOL N/A 05/24/2022   Procedure: COLONOSCOPY WITH PROPOFOL;  Surgeon: Dolores Frame, MD;  [facility];  Service: Gastroenterology;  Laterality: N/A;  130 ASA 2   ESOPHAGOGASTRODUODENOSCOPY (EGD) WITH PROPOFOL N/A 05/24/2022   Procedure: ESOPHAGOGASTRODUODENOSCOPY (EGD) WITH PROPOFOL;  Surgeon: Dolores Frame, MD;  [facility];  Service: Gastroenterology;  Laterality: N/A;   EYE SURGERY     benign tumor   MASS EXCISION Left 10/27/2015   Procedure: EXCISION MASS LEFT BROW ( EXCISION OF SUBMUSCULAR MASS LEFT BROW 2.5CM);  Surgeon: Glenna Fellows, MD;  Location: [facility];  Service: Plastics;  Laterality: Left;    Social History: Social History   Tobacco Use   Smoking status: Never    Passive exposure: Never   Smokeless tobacco: Never  Vaping Use   Vaping status: Never Used  Substance Use Topics   Alcohol use: No    Alcohol/week: 0.0 standard drinks of alcohol   Drug use: No     ROS: Negative for fevers, chills. Positive for headaches. All other systems reviewed and negative unless stated otherwise in HPI.   Physical Exam:   GENERAL: well appearing,in no acute distress,alert  NEUROLOGICAL: Mental Status: Alert, oriented to person, place and time,Follows commands      IMPRESSION: 48 year old female with a history of Crohn's disease, rheumatoid arthritis, anxiety and migraines with visual auras in her 30's who presents for follow-up of worsening daily headaches which began in August 2023, most consistent with tension type headaches.  MRI brain 10/2022 unremarkable.  Noted  improvement of tension type headaches but had recurrence of migraines with visual aura around 02/2023 and have since improved on propranolol.    PLAN: -Prevention: Continue propranolol 20 mg twice daily -Rescue: Continue rizatriptan 10mg  as needed, can repeat after 2 hrs if needed. Use of Zofran as needed. Continue baclofen, will lower dosage to 5mg  tablets as needed -Next steps: consider amitriptyline but will need to stop hydroxyzine (takes only on occasion for anxiety), consider CGRP or botox     Follow-up in 6 months or call earlier if needed     I spent 15 minutes of face-to-face and non-face-to-face time with patient via MyChart video visit.  This included previsit chart review, lab review, study review, order entry, electronic health record documentation, patient education and discussion regarding above diagnoses and treatment plan and answered all other questions to patient's satisfaction  Ihor Austin, [facility]  [facility] [address] [place], [place] [address]  Phone [phone] Fax [phone] Note: This document was prepared with digital dictation and possible smart phrase technology. Any transcriptional errors that result from this process are unintentional.

## 2023-11-06 ENCOUNTER — Telehealth (INDEPENDENT_AMBULATORY_CARE_PROVIDER_SITE_OTHER): Payer: Self-pay | Admitting: *Deleted

## 2023-11-06 ENCOUNTER — Other Ambulatory Visit (INDEPENDENT_AMBULATORY_CARE_PROVIDER_SITE_OTHER): Payer: Self-pay | Admitting: *Deleted

## 2023-11-06 ENCOUNTER — Encounter (INDEPENDENT_AMBULATORY_CARE_PROVIDER_SITE_OTHER): Payer: Self-pay | Admitting: *Deleted

## 2023-11-06 DIAGNOSIS — K5080 Crohn's disease of both small and large intestine without complications: Secondary | ICD-10-CM

## 2023-11-06 DIAGNOSIS — Z79899 Other long term (current) drug therapy: Secondary | ICD-10-CM

## 2023-11-06 NOTE — Telephone Encounter (Addendum)
-----   Message from Nurse Mazie Speed sent at 11/07/2022 10:42 AM EDT ----- Regarding: labs Can we place on recall for repeat Hep B and TB in 1 year per [place]. Due 11/07/23   Patient mailed a letter with lab results.

## 2023-11-17 ENCOUNTER — Encounter (INDEPENDENT_AMBULATORY_CARE_PROVIDER_SITE_OTHER): Payer: Self-pay

## 2023-11-17 LAB — HEPATITIS B SURFACE ANTIGEN: Hepatitis B Surface Ag: NEGATIVE

## 2023-11-17 LAB — QUANTIFERON-TB GOLD PLUS
QuantiFERON Mitogen Value: 8.62 [IU]/mL
QuantiFERON Nil Value: 0.02 [IU]/mL
QuantiFERON TB1 Ag Value: 0.06 [IU]/mL
QuantiFERON TB2 Ag Value: 0.02 [IU]/mL

## 2023-12-22 ENCOUNTER — Other Ambulatory Visit (INDEPENDENT_AMBULATORY_CARE_PROVIDER_SITE_OTHER): Payer: Self-pay | Admitting: Gastroenterology

## 2024-01-15 ENCOUNTER — Other Ambulatory Visit (INDEPENDENT_AMBULATORY_CARE_PROVIDER_SITE_OTHER): Payer: Self-pay

## 2024-01-15 DIAGNOSIS — K5080 Crohn's disease of both small and large intestine without complications: Secondary | ICD-10-CM

## 2024-01-15 MED ORDER — HYRIMOZ 40 MG/0.4ML ~~LOC~~ SOAJ: 40.0000 mg | SUBCUTANEOUS | 3 refills | Status: AC

## 2024-01-15 NOTE — Telephone Encounter (Signed)
 [facility] is calling requesting a 90 day script on patient Tamara Martin . Please submit to them Thanks

## 2024-01-15 NOTE — Telephone Encounter (Signed)
 Hi Amalia Badder,  Can you please schedule a follow up appointment for this patient in 4-6 weeks  with me or any of the APPs?  Thanks,  Yeira Gulden Faizan Denijah Karrer , MD Gastroenterology and Hepatology [facility]

## 2024-02-04 ENCOUNTER — Ambulatory Visit (INDEPENDENT_AMBULATORY_CARE_PROVIDER_SITE_OTHER): Admitting: Gastroenterology

## 2024-02-16 ENCOUNTER — Ambulatory Visit (INDEPENDENT_AMBULATORY_CARE_PROVIDER_SITE_OTHER): Admitting: Gastroenterology

## 2024-02-16 ENCOUNTER — Encounter (INDEPENDENT_AMBULATORY_CARE_PROVIDER_SITE_OTHER): Payer: Self-pay | Admitting: Gastroenterology

## 2024-02-16 VITALS — BP 119/75 | HR 59 | Temp 98.3°F | Ht 65.0 in | Wt 156.2 lb

## 2024-02-16 DIAGNOSIS — E611 Iron deficiency: Secondary | ICD-10-CM

## 2024-02-16 DIAGNOSIS — K5080 Crohn's disease of both small and large intestine without complications: Secondary | ICD-10-CM | POA: Diagnosis not present

## 2024-02-16 DIAGNOSIS — K5904 Chronic idiopathic constipation: Secondary | ICD-10-CM

## 2024-02-16 NOTE — Progress Notes (Signed)
 [name] , M.D. Gastroenterology & Hepatology [facility] [address] [place], [place] [address] Primary Care Physician: Signa Delon LABOR, NP [address] [place] [address]  Chief Complaint:   ileocolonic Crohn's disease  History of Present Illness: Tamara Martin is a 48 y.o. female  with past medical history of ileocolonic Crohn's disease who presents for follow up of Crohn's disease   Patient reports that she is feeling well has recurrence of joint pain and abdominal discomfort right before couple days before injecting Hyrimoz  .  Patient reported that she felt well taking Humira  and had achieved endoscopic remission at that time.  Unfortunately Humira  had to be changed to Hyrimoz   due to insurance issues The patient denies having any nausea, vomiting, fever, chills, hematochezia, melena, hematemesis, abdominal distention, abdominal pain, diarrhea, jaundice, pruritus or weight loss. Patient rather has constipation and takes MiraLAX  daily  IBD HX: Date of dx: age 33  Extent of disease on first presentation: Distal proctitis  Previous therapies: Pentasa  4gm, Humira  , Canasa   Current therapy: Hyrimoz  40mg  Q2w  Extraintestinal manifestations: Arthritis following with rheum  Co-existing immune conditions: none    History : Colonoscopy in February 2017 revealed normal terminal ileum and colonoscopy except mild distal proctitis and she was treated with Canasa  suppositories.  She was maintained on oral mesalamine  which was subsequently discontinued in August 2022 when she was begun on Humira /adalimumab  for arthritis and she is being followed in rheumatology clinic in [place]. 07/2022 due to insurance issues Humira  was switched to Hyrimoz  ( adlimumab biosimilar) . Rheumatology manages her medication therapy as she also has history of RA.   Last Colonoscopy:05/2022- 1 cm sliding hiatal hernia. 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 - Normal stomach. Biopsied. Last EGD: 05/2022- The examined portion of the ileum was normal. Biopsied. - The entire examined colon is normal. Biopsied. - Non- bleeding internal hemorrhoids.    A. SMALL BOWEL, BIOPSY:  Benign duodenal mucosa with no diagnostic abnormality   B. GASTRIC BIOPSY:  Reactive gastropathy and mild chronic gastritis  Negative for H. pylori, intestinal metaplasia, dysplasia and carcinoma   C. TERMINAL, ILEUM, BIOPSY:  Benign ileal mucosa with no diagnostic abnormality   D. CECUM AND ASCENDING COLON, BIOPSY:  Benign colonic mucosa with no diagnostic abnormality   E. TRANSVERSE COLON, BIOPSY:  Benign colonic mucosa with no diagnostic abnormality   F. SIGMOID AND DESCENDING COLON, BIOPSY:  Benign colonic mucosa with no diagnostic abnormality   G. RECTAL BIOPSY:  Benign colonic mucosa with no diagnostic abnormality    Past Medical History: Past Medical History:  Diagnosis Date   Anxiety 03/04/2013   Arthritis    rheumatoid   Crohn's disease of colon (HCC)    Dermoid cyst of eyebrow 10/2015   left   Fibroadenoma of right breast 02/27/2017   Headache, menstrual migraine     Past Surgical History: Past Surgical History:  Procedure Laterality Date   BIOPSY  05/24/2022   Procedure: BIOPSY;  Surgeon: [name], MD;  [facility];  Service: Gastroenterology;;   COLONOSCOPY  06/19/2005   COLONOSCOPY N/A 08/10/2015   Procedure: COLONOSCOPY;  Surgeon: Claudis RAYMOND Rivet, MD;  [facility]: [facility];  Service: Endoscopy;  Laterality: N/A;  1:25   COLONOSCOPY WITH PROPOFOL  N/A 05/24/2022   Procedure: COLONOSCOPY WITH PROPOFOL ;  Surgeon: [name], MD;  [facility];  Service: Gastroenterology;  Laterality: N/A;  130 ASA 2   ESOPHAGOGASTRODUODENOSCOPY (EGD) WITH PROPOFOL  N/A 05/24/2022   Procedure: ESOPHAGOGASTRODUODENOSCOPY (EGD) WITH  PROPOFOL ;  Surgeon: [name], MD;  [facility];  Service: Gastroenterology;  Laterality: N/A;   EYE SURGERY     benign tumor   MASS EXCISION Left 10/27/2015   Procedure: EXCISION MASS LEFT BROW ( EXCISION OF SUBMUSCULAR MASS LEFT BROW 2.5CM);  Surgeon: Earlis Ranks, MD;  Location: [facility];  Service: Plastics;  Laterality: Left;    Family History: Family History  Problem Relation Age of Onset   Diabetes Maternal Grandmother    Hypertension Maternal Grandmother    Kidney failure Maternal Grandmother    Diabetes Maternal Grandfather    Cirrhosis Father    Cancer Father        stomach and bone   Atrial fibrillation Mother    Cancer Maternal Aunt        lung   Cancer Maternal Uncle        bones    Social History: Social History   Tobacco Use  Smoking Status Never   Passive exposure: Never  Smokeless Tobacco Never   Social History   Substance and Sexual Activity  Alcohol Use No   Alcohol/week: 0.0 standard drinks of alcohol   Social History   Substance and Sexual Activity  Drug Use No    Allergies: Allergies  Allergen Reactions   Penicillins Hives         Medications: Current Outpatient Medications  Medication Sig Dispense Refill   B Complex Vitamins (B COMPLEX PO) Place 1 drop under the tongue daily.     Baclofen  5 MG TABS Take 1 tablet (5 mg total) by mouth 2 (two) times daily as needed (for tension headache). 60 tablet 5   busPIRone  (BUSPAR ) 5 MG tablet TAKE 1 TABLET BY MOUTH TWICE A DAY 180 tablet 5   CALCIUM  PO Take 1 tablet by mouth daily.     Cholecalciferol  125 MCG (5000 UT) capsule Take 1 capsule (5,000 Units total) by mouth daily.     hydrOXYzine  (ATARAX ) 10 MG tablet Take 1 tablet (10 mg total) by mouth 3 (three) times daily as needed. 30 tablet 3   HYRIMOZ  40 MG/0.4ML SOAJ Inject 40 mg into the skin every 14 (fourteen) days. 2.4 mL 3   polyethylene glycol (MIRALAX  / GLYCOLAX ) 17 g packet Take 17 g by  mouth daily as needed for moderate constipation.     Probiotic Product (PROBIOTIC PO) Take 1 capsule by mouth daily.     propranolol  (INDERAL ) 20 MG tablet Take 1 tablet (20 mg total) by mouth 2 (two) times daily. 60 tablet 5   No current facility-administered medications for this visit.    Review of Systems: GENERAL: negative for malaise, night sweats HEENT: No changes in hearing or vision, no nose bleeds or other nasal problems. NECK: Negative for lumps, goiter, pain and significant neck swelling RESPIRATORY: Negative for cough, wheezing CARDIOVASCULAR: Negative for chest pain, leg swelling, palpitations, orthopnea GI: SEE HPI MUSCULOSKELETAL: Negative for joint pain or swelling, back pain, and muscle pain. SKIN: Negative for lesions, rash HEMATOLOGY Negative for prolonged bleeding, bruising easily, and swollen nodes. ENDOCRINE: Negative for cold or heat intolerance, polyuria, polydipsia and goiter. NEURO: negative for tremor, gait imbalance, syncope and seizures. The remainder of the review of systems is noncontributory.   Physical Exam: BP 119/75   Pulse (!) 59   Temp 98.3 F (36.8 C)  Ht 5' 5 (1.651 m)   Wt 156 lb 3.2 oz (70.9 kg)   LMP 02/04/2024 (Approximate)   BMI 25.99 kg/m  GENERAL: The patient is AO x3, in no acute distress. HEENT: Head is normocephalic and atraumatic. EOMI are intact. Mouth is well hydrated and without lesions. NECK: Supple. No masses LUNGS: Clear to auscultation. No presence of rhonchi/wheezing/rales. Adequate chest expansion HEART: RRR, normal s1 and s2. ABDOMEN: Soft, nontender, no guarding, no peritoneal signs, and nondistended. BS +. No masses.  Imaging/Labs: as above     Latest Ref Rng & Units 01/17/2023    9:38 AM 09/17/2022   11:51 AM 04/09/2022   12:41 AM  CBC  WBC 3.4 - 10.8 x10E3/uL 7.8  7.4  10.6   Hemoglobin 11.1 - 15.9 g/dL 86.5  86.7  86.7   Hematocrit 34.0 - 46.6 % 41.0  40.3  39.5   Platelets 150 - 450 x10E3/uL 327  371   289    Lab Results  Component Value Date   IRON 66 09/17/2022   FERRITIN 16 01/17/2023    I personally reviewed and interpreted the available labs, imaging and endoscopic files.  Impression and Plan:  Tamara Martin is a 48 y.o. female  with past medical history of ileocolonic Crohn's disease who presents for follow up of Crohn's disease   #Crohns disease   Patient does seem to have relapse of symptoms right before her date of injection.  Might have suboptimal levels are not responding well to IV  Suggested Adalimumab : >7.5 mcg/mL for patient with Crohn's disease . Patient level is borderline low ( 7)   Given iron deficiency and borderline levels with symptoms I recommended early colonoscopy , but patient want to defer for now   Will obtain CRP, fecal calpro to assess inflammatory markers, Adalimumab  levels just before the next injection  If levels are low or patient continues to be symptomatic will increase the dose .The maximum labeled maintenance dosing for Crohn's disease with Hyrimoz  (adalimumab -adaz) is 40 mg subcutaneously every week . Will make q1 weeks   Hep B and TB negative - repeat in 1 year   If patient is approved for humira  that would be ideal as previously had remission with it and can be restarted on it after induction.  Patient reports that recently rheumatology was able to get it approved for the patient.  She will follow-up with rheumatologist  Advised patient to stay up-to-date to all vaccinations  All questions were answered.      Tamara Wallick Faizan Gaberial Cada, MD Gastroenterology and Hepatology [facility]   This chart has been completed using [facility] Dictation software, and while attempts have been made to ensure accuracy , certain words and phrases may not be transcribed as intended

## 2024-02-16 NOTE — Patient Instructions (Signed)
 It was very nice to meet you today, as dicussed with will plan for the following :  1) labs just before your next shot

## 2024-02-25 LAB — ADALIMUMAB+AB (SERIAL MONITOR)
Adalimumab Drug Level: 6.1 ug/mL
Anti-Adalimumab Antibody: <25 ng/mL

## 2024-02-25 LAB — C-REACTIVE PROTEIN: CRP: <1 mg/L (ref 0–10)

## 2024-02-25 LAB — SERIAL MONITORING

## 2024-02-28 LAB — CALPROTECTIN, FECAL: Calprotectin, Fecal: 22 ug/g (ref 0–120)

## 2024-03-04 ENCOUNTER — Ambulatory Visit (INDEPENDENT_AMBULATORY_CARE_PROVIDER_SITE_OTHER): Payer: Self-pay | Admitting: Gastroenterology

## 2024-03-04 NOTE — Progress Notes (Signed)
 Adalimumab  drug level :6.1  Anti-Adalimumab  antibody <25   Fecal calpro :22  CRP<1 ===============  Suggested Adalimumab : >7.5 mcg/mL for patient with Crohn's disease . Patient level is low and hence reports being symptomatic   The maximum labeled maintenance dosing for Crohn's disease with Hyrimoz  (adalimumab -adaz) is 40 mg subcutaneously every week .    Will discuss increasing the dose to every week

## 2024-04-21 ENCOUNTER — Encounter (INDEPENDENT_AMBULATORY_CARE_PROVIDER_SITE_OTHER): Payer: Self-pay | Admitting: Gastroenterology

## 2024-05-06 ENCOUNTER — Ambulatory Visit
Admission: EM | Admit: 2024-05-06 | Discharge: 2024-05-06 | Disposition: A | Discharge status: Home / Self Care | Attending: Nurse Practitioner | Admitting: Nurse Practitioner

## 2024-05-06 ENCOUNTER — Ambulatory Visit (INDEPENDENT_AMBULATORY_CARE_PROVIDER_SITE_OTHER)

## 2024-05-06 DIAGNOSIS — R059 Cough, unspecified: Secondary | ICD-10-CM

## 2024-05-06 DIAGNOSIS — J209 Acute bronchitis, unspecified: Secondary | ICD-10-CM | POA: Diagnosis not present

## 2024-05-06 MED ORDER — PREDNISONE 20 MG PO TABS: 40.0000 mg | ORAL_TABLET | Freq: Every day | ORAL | 0 refills | Status: AC

## 2024-05-06 MED ORDER — METHYLPREDNISOLONE SODIUM SUCC 125 MG IJ SOLR
80.0000 mg | Freq: Once | INTRAMUSCULAR | Status: AC
  Administered 2024-05-06: 80 mg via INTRAMUSCULAR

## 2024-05-06 MED ORDER — ALBUTEROL SULFATE HFA 108 (90 BASE) MCG/ACT IN AERS: 2.0000 | INHALATION_SPRAY | Freq: Four times a day (QID) | RESPIRATORY_TRACT | 0 refills | Status: AC | PRN

## 2024-05-06 NOTE — ED Triage Notes (Signed)
 Pt reports cough x 3 weeks, was seen at an urgent care Friday got a steroid shot, and an antibiotic, pain in chest and back when coughing.

## 2024-05-06 NOTE — Discharge Instructions (Addendum)
 The chest x-ray was negative. Take medication as prescribed.  Continue the cough medicine you were previously prescribed for nighttime cough.   You may take over-the-counter Delsym or Robitussin during the daytime. Increase fluids and allow for plenty of rest. Recommend use of a humidifier in your bedroom at nighttime during sleep and sleeping elevated on pillows while cough symptoms persist. As discussed, your cough may last from days to weeks.  If you are generally feeling well but continued to have a persistent nagging cough, continue to drink plenty of fluids, use over-the-counter cough medications, and cough drops.  Seek care if you develop fever, chills, wheezing, shortness of breath, difficulty breathing. Follow-up as needed.

## 2024-05-06 NOTE — ED Provider Notes (Signed)
 [facility]    CSN: [id number] Arrival date & time: 05/06/24  1355      History   Chief Complaint No chief complaint on file.   HPI Tamara Martin is a 48 y.o. female.   The history is provided by the patient.   Patient presents for complaints of cough that has been present for the past 3 weeks.  Patient states she did go to another urgent care 5 days ago.  She states that she was given a steroid shot, antibiotics, and cough medicine.  She states she started the antibiotics 2 days later, states that she finished them today.  She states that her cough has remained persistent, states that she does not feel much better.  Patient denies fever, chills, wheezing, difficulty breathing, chest pain, abdominal pain, nausea, vomiting, diarrhea, or rash.  Patient states that she has been taking other over-the-counter cough medications for her symptoms with minimal relief.  Patient denies any obvious close sick contacts.  Per review of chart, patient with elevated A1c, most recent A1c was in March of last year which was 6.0.  Past Medical History:  Diagnosis Date   Anxiety 03/04/2013   Arthritis    rheumatoid   Crohn's disease of colon (HCC)    Dermoid cyst of eyebrow 10/2015   left   Fibroadenoma of right breast 02/27/2017   Headache, menstrual migraine     Patient Active Problem List   Diagnosis Date Noted   Right lower quadrant abdominal tenderness with rebound tenderness 01/17/2023   Constipation 10/22/2022   Elevated cholesterol with high triglycerides 09/20/2022   Low vitamin D  level 09/20/2022   Elevated hemoglobin A1c 09/17/2022   Tired 09/17/2022   Changes in vision 09/17/2022   Frequent headaches 09/17/2022   Health care maintenance 05/01/2022   Epigastric pain 04/17/2022   Early satiety 04/17/2022   Crohn's disease of both small and large intestine without complication (HCC) 04/16/2022   Screening for diabetes mellitus 04/10/2022   RLQ abdominal pain  03/20/2022   Scalp pruritus 03/20/2022   Dizzy spells 03/20/2022   History of Helicobacter pylori infection 10/09/2021   Bacterial infection due to H. pylori 09/19/2020   Atypical chest pain 09/19/2020   Encounter for screening fecal occult blood testing 04/11/2020   Fibroadenoma of right breast 02/27/2017   Encounter for gynecological examination with Papanicolaou smear of cervix 03/14/2016   Rectocele 03/14/2016   Anxiety 03/04/2013   Fatigue 02/24/2013   Irregular menses 02/24/2013   History of migraine headaches 10/14/2012   Crohn's disease (HCC) 08/12/2011   Arthritis of knee, left 08/12/2011    Past Surgical History:  Procedure Laterality Date   BIOPSY  05/24/2022   Procedure: BIOPSY;  Surgeon: [name], MD;  [facility];  Service: Gastroenterology;;   COLONOSCOPY  06/19/2005   COLONOSCOPY N/A 08/10/2015   Procedure: COLONOSCOPY;  Surgeon: Claudis RAYMOND Rivet, MD;  [facility];  Service: Endoscopy;  Laterality: N/A;  1:25   COLONOSCOPY WITH PROPOFOL  N/A 05/24/2022   Procedure: COLONOSCOPY WITH PROPOFOL ;  Surgeon: [name], MD;  [facility];  Service: Gastroenterology;  Laterality: N/A;  130 ASA 2   ESOPHAGOGASTRODUODENOSCOPY (EGD) WITH PROPOFOL  N/A 05/24/2022   Procedure: ESOPHAGOGASTRODUODENOSCOPY (EGD) WITH PROPOFOL ;  Surgeon: [name], MD;  [facility];  Service: Gastroenterology;  Laterality: N/A;   EYE SURGERY     benign tumor   MASS EXCISION Left 10/27/2015   Procedure: EXCISION MASS LEFT  BROW ( EXCISION OF SUBMUSCULAR MASS LEFT BROW 2.5CM);  Surgeon: Earlis Ranks, MD;  Location: [facility];  Service: Plastics;  Laterality: Left;    OB History     Gravida  4   Para  3   Term      Preterm      AB  1   Living  3      SAB  1   IAB      Ectopic      Multiple      Live Births  3            Home Medications    Prior to Admission  medications   Medication Sig Start Date End Date Taking? Authorizing Provider  albuterol (VENTOLIN HFA) 108 (90 Base) MCG/ACT inhaler Inhale 2 puffs into the lungs every 6 (six) hours as needed. 05/06/24  Yes Leath-Warren, Etta PARAS, NP  predniSONE  (DELTASONE ) 20 MG tablet Take 2 tablets (40 mg total) by mouth daily with breakfast for 5 days. 05/06/24 05/11/24 Yes Leath-Warren, Etta PARAS, NP  B Complex Vitamins (B COMPLEX PO) Place 1 drop under the tongue daily.    [provider]  Baclofen  5 MG TABS Take 1 tablet (5 mg total) by mouth 2 (two) times daily as needed (for tension headache). 10/02/23   Whitfield Raisin, NP  busPIRone  (BUSPAR ) 5 MG tablet TAKE 1 TABLET BY MOUTH TWICE A DAY 04/02/23   Signa Nest A, NP  CALCIUM  PO Take 1 tablet by mouth daily.    [provider]  Cholecalciferol  125 MCG (5000 UT) capsule Take 1 capsule (5,000 Units total) by mouth daily. 09/20/22   Signa Nest LABOR, NP  hydrOXYzine  (ATARAX ) 10 MG tablet Take 1 tablet (10 mg total) by mouth 3 (three) times daily as needed. 03/20/22   Signa Nest LABOR, NP  HYRIMOZ  40 MG/0.4ML SOAJ Inject 40 mg into the skin every 14 (fourteen) days. 01/15/24   Ahmed, Muhammad F, MD  polyethylene glycol (MIRALAX  / GLYCOLAX ) 17 g packet Take 17 g by mouth daily as needed for moderate constipation.    [provider]  Probiotic Product (PROBIOTIC PO) Take 1 capsule by mouth daily.    [provider]  propranolol  (INDERAL ) 20 MG tablet Take 1 tablet (20 mg total) by mouth 2 (two) times daily. 10/02/23   Whitfield Raisin, NP    Family History Family History  Problem Relation Age of Onset   Diabetes Maternal Grandmother    Hypertension Maternal Grandmother    Kidney failure Maternal Grandmother    Diabetes Maternal Grandfather    Cirrhosis Father    Cancer Father        stomach and bone   Atrial fibrillation Mother    Cancer Maternal Aunt        lung   Cancer Maternal Uncle        bones     Social History Social History   Tobacco Use   Smoking status: Never    Passive exposure: Never   Smokeless tobacco: Never  Vaping Use   Vaping status: Never Used  Substance Use Topics   Alcohol use: No    Alcohol/week: 0.0 standard drinks of alcohol   Drug use: No     Allergies   Penicillins   Review of Systems Review of Systems Per HPI  Physical Exam Triage Vital Signs ED Triage Vitals  Encounter Vitals Group     BP 05/06/24 1428 124/85     Girls  Systolic BP Percentile --      Girls Diastolic BP Percentile --      Boys Systolic BP Percentile --      Boys Diastolic BP Percentile --      Pulse Rate 05/06/24 1428 96     Resp 05/06/24 1428 20     Temp 05/06/24 1428 98.3 F (36.8 C)     Temp Source 05/06/24 1428 Oral     SpO2 05/06/24 1428 96 %     Weight --      Height --      Head Circumference --      Peak Flow --      Pain Score 05/06/24 1431 6     Pain Loc --      Pain Education --      Exclude from Growth Chart --    No data found.  Updated Vital Signs BP 124/85 (BP Location: Right Arm)   Pulse 96   Temp 98.3 F (36.8 C) (Oral)   Resp 20   LMP 04/13/2024 (Within Weeks)   SpO2 96%   Visual Acuity Right Eye Distance:   Left Eye Distance:   Bilateral Distance:    Right Eye Near:   Left Eye Near:    Bilateral Near:     Physical Exam Vitals and nursing note reviewed.  Constitutional:      General: She is not in acute distress.    Appearance: Normal appearance.  HENT:     Head: Normocephalic.     Right Ear: Tympanic membrane, ear canal and external ear normal.     Left Ear: Tympanic membrane, ear canal and external ear normal.     Nose: Nose normal.     Mouth/Throat:     Lips: Pink.     Mouth: Mucous membranes are moist.     Pharynx: Oropharynx is clear. Uvula midline. Postnasal drip present.     Comments: Cobblestoning present to posterior oropharynx  Eyes:     Extraocular Movements: Extraocular movements intact.      Conjunctiva/sclera: Conjunctivae normal.     Pupils: Pupils are equal, round, and reactive to light.  Cardiovascular:     Rate and Rhythm: Normal rate and regular rhythm.     Pulses: Normal pulses.     Heart sounds: Normal heart sounds.  Pulmonary:     Effort: Pulmonary effort is normal. No respiratory distress.     Breath sounds: Normal breath sounds. No stridor. No wheezing, rhonchi or rales.  Abdominal:     General: Bowel sounds are normal.     Palpations: Abdomen is soft.  Musculoskeletal:     Cervical back: Normal range of motion.  Skin:    General: Skin is warm and dry.  Neurological:     General: No focal deficit present.     Mental Status: She is alert and oriented to person, place, and time.  Psychiatric:        Mood and Affect: Mood normal.        Behavior: Behavior normal.      UC Treatments / Results  Labs (all labs ordered are listed, but only abnormal results are displayed) Labs Reviewed - No data to display  EKG   Radiology DG Chest 2 View Result Date: 05/06/2024 CLINICAL DATA:  Cough 3 weeks. EXAM: CHEST - 2 VIEW COMPARISON:  04/09/2022 FINDINGS: Lungs are adequately inflated and otherwise clear. Cardiomediastinal silhouette and remainder of the exam is unchanged. IMPRESSION: No active cardiopulmonary disease. Electronically Signed  By: Toribio Agreste M.D.   On: 05/06/2024 14:48    Procedures Procedures (including critical care time)  Medications Ordered in UC Medications  methylPREDNISolone sodium succinate (SOLU-MEDROL) 125 mg/2 mL injection 80 mg (80 mg Intramuscular Given 05/06/24 1501)    Initial Impression / Assessment and Plan / UC Course  I have reviewed the triage vital signs and the nursing notes.  Pertinent labs & imaging results that were available during my care of the patient were reviewed by me and considered in my medical decision making (see chart for details).  Patient with cough for approximately 3 weeks.  Chest x-ray was  negative for active cardiopulmonary disease.  Symptoms are consistent with acute bronchitis.  Patient has completed a round of antibiotics, which she finished today.  Solu-Medrol 80 mg IM administered for bronchial inflammation..  Will treat with prednisone  40 mg for the next 5 days.  An albuterol inhaler was also prescribed for shortness of breath or bronchospasm.  Supportive care recommendations were provided and discussed with the patient to include fluids, rest, over-the-counter analgesics, and use of a humidifier during sleep.  Discussed indications with patient regarding follow-up.  Patient was in agreement with this plan of care and verbalizes understanding.  All questions were answered.  Patient stable for discharge.   Final Clinical Impressions(s) / UC Diagnoses   Final diagnoses:  Cough, unspecified type  Acute bronchitis, unspecified organism     Discharge Instructions      The chest x-ray was negative. Take medication as prescribed.  Continue the cough medicine you were previously prescribed for nighttime cough.   You may take over-the-counter Delsym or Robitussin during the daytime. Increase fluids and allow for plenty of rest. Recommend use of a humidifier in your bedroom at nighttime during sleep and sleeping elevated on pillows while cough symptoms persist. As discussed, your cough may last from days to weeks.  If you are generally feeling well but continued to have a persistent nagging cough, continue to drink plenty of fluids, use over-the-counter cough medications, and cough drops.  Seek care if you develop fever, chills, wheezing, shortness of breath, difficulty breathing. Follow-up as needed.     ED Prescriptions     Medication Sig Dispense Auth. Provider   predniSONE  (DELTASONE ) 20 MG tablet Take 2 tablets (40 mg total) by mouth daily with breakfast for 5 days. 10 tablet Leath-Warren, Etta PARAS, NP   albuterol (VENTOLIN HFA) 108 (90 Base) MCG/ACT inhaler Inhale 2  puffs into the lungs every 6 (six) hours as needed. 8 g Leath-Warren, Etta PARAS, NP      PDMP not reviewed this encounter.   Gilmer Etta PARAS, NP 05/06/24 [phone]

## 2024-05-07 ENCOUNTER — Ambulatory Visit

## 2024-08-03 ENCOUNTER — Other Ambulatory Visit (HOSPITAL_COMMUNITY): Payer: Self-pay | Admitting: Adult Health

## 2024-08-03 DIAGNOSIS — Z1231 Encounter for screening mammogram for malignant neoplasm of breast: Secondary | ICD-10-CM

## 2024-08-09 ENCOUNTER — Encounter (HOSPITAL_COMMUNITY)

## 2024-08-09 DIAGNOSIS — Z1231 Encounter for screening mammogram for malignant neoplasm of breast: Secondary | ICD-10-CM
